# Patient Record
Sex: Female | Born: 1985 | Race: Black or African American | Hispanic: No | Marital: Single | Smoking: Former smoker
Health system: Southern US, Community
[De-identification: ages and names within clinical notes are randomized; demographics above are authoritative.]

## PROBLEM LIST (undated history)

## (undated) ENCOUNTER — Inpatient Hospital Stay (HOSPITAL_COMMUNITY): Payer: Self-pay

## (undated) DIAGNOSIS — B999 Unspecified infectious disease: Secondary | ICD-10-CM

## (undated) DIAGNOSIS — D219 Benign neoplasm of connective and other soft tissue, unspecified: Secondary | ICD-10-CM

## (undated) DIAGNOSIS — I10 Essential (primary) hypertension: Secondary | ICD-10-CM

## (undated) DIAGNOSIS — D649 Anemia, unspecified: Secondary | ICD-10-CM

## (undated) DIAGNOSIS — R87629 Unspecified abnormal cytological findings in specimens from vagina: Secondary | ICD-10-CM

## (undated) DIAGNOSIS — A599 Trichomoniasis, unspecified: Secondary | ICD-10-CM

## (undated) HISTORY — PX: NO PAST SURGERIES: SHX2092

---
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
## 2009-06-25 ENCOUNTER — Emergency Department (HOSPITAL_COMMUNITY): Admission: EM | Admit: 2009-06-25 | Discharge: 2009-06-25 | Payer: Self-pay | Admitting: Emergency Medicine

## 2010-12-14 LAB — POCT PREGNANCY, URINE: Operator id: 140111

## 2011-05-30 ENCOUNTER — Emergency Department (HOSPITAL_COMMUNITY)
Admission: EM | Admit: 2011-05-30 | Discharge: 2011-05-30 | Disposition: A | Discharge status: Home / Self Care | Payer: Medicaid Other | Attending: Emergency Medicine | Admitting: Emergency Medicine

## 2011-05-30 ENCOUNTER — Encounter (HOSPITAL_COMMUNITY): Payer: Self-pay | Admitting: *Deleted

## 2011-05-30 DIAGNOSIS — R51 Headache: Secondary | ICD-10-CM | POA: Insufficient documentation

## 2011-05-30 DIAGNOSIS — S0003XA Contusion of scalp, initial encounter: Secondary | ICD-10-CM | POA: Insufficient documentation

## 2011-05-30 DIAGNOSIS — S0083XA Contusion of other part of head, initial encounter: Secondary | ICD-10-CM

## 2011-05-30 DIAGNOSIS — S0990XA Unspecified injury of head, initial encounter: Secondary | ICD-10-CM | POA: Insufficient documentation

## 2011-05-30 DIAGNOSIS — H5789 Other specified disorders of eye and adnexa: Secondary | ICD-10-CM | POA: Insufficient documentation

## 2011-05-30 DIAGNOSIS — H1130 Conjunctival hemorrhage, unspecified eye: Secondary | ICD-10-CM | POA: Insufficient documentation

## 2011-05-30 MED ORDER — IBUPROFEN 200 MG PO TABS
400.0000 mg | ORAL_TABLET | Freq: Once | ORAL | Status: AC
  Administered 2011-05-30: 400 mg via ORAL
  Filled 2011-05-30: qty 2

## 2011-05-30 MED ORDER — OXYCODONE-ACETAMINOPHEN 5-325 MG PO TABS
2.0000 | ORAL_TABLET | Freq: Once | ORAL | Status: AC
  Administered 2011-05-30: 2 via ORAL
  Filled 2011-05-30: qty 2
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
## 2014-03-10 ENCOUNTER — Inpatient Hospital Stay (HOSPITAL_COMMUNITY)
Admission: AD | Admit: 2014-03-10 | Discharge: 2014-03-10 | Disposition: A | Discharge status: Home / Self Care | Payer: Medicaid Other | Source: Ambulatory Visit | Attending: Obstetrics & Gynecology | Admitting: Obstetrics & Gynecology

## 2014-03-10 ENCOUNTER — Inpatient Hospital Stay (HOSPITAL_COMMUNITY): Payer: Medicaid Other

## 2014-03-10 ENCOUNTER — Encounter (HOSPITAL_COMMUNITY): Payer: Self-pay | Admitting: *Deleted

## 2014-03-10 DIAGNOSIS — N939 Abnormal uterine and vaginal bleeding, unspecified: Secondary | ICD-10-CM | POA: Diagnosis present

## 2014-03-10 DIAGNOSIS — O99331 Smoking (tobacco) complicating pregnancy, first trimester: Secondary | ICD-10-CM | POA: Insufficient documentation

## 2014-03-10 DIAGNOSIS — A5901 Trichomonal vulvovaginitis: Secondary | ICD-10-CM | POA: Insufficient documentation

## 2014-03-10 DIAGNOSIS — F17210 Nicotine dependence, cigarettes, uncomplicated: Secondary | ICD-10-CM | POA: Insufficient documentation

## 2014-03-10 DIAGNOSIS — Z3A01 Less than 8 weeks gestation of pregnancy: Secondary | ICD-10-CM | POA: Diagnosis not present

## 2014-03-10 DIAGNOSIS — O26851 Spotting complicating pregnancy, first trimester: Secondary | ICD-10-CM | POA: Insufficient documentation

## 2014-03-10 DIAGNOSIS — O98311 Other infections with a predominantly sexual mode of transmission complicating pregnancy, first trimester: Secondary | ICD-10-CM | POA: Insufficient documentation

## 2014-03-10 DIAGNOSIS — O209 Hemorrhage in early pregnancy, unspecified: Secondary | ICD-10-CM

## 2014-03-10 HISTORY — DX: Unspecified infectious disease: B99.9

## 2014-03-10 HISTORY — DX: Anemia, unspecified: D64.9

## 2014-03-10 LAB — CBC WITH DIFFERENTIAL/PLATELET
BASOS ABS: 0.1 10*3/uL (ref 0.0–0.1)
Basophils Relative: 1 % (ref 0–1)
Eosinophils Absolute: 0.5 10*3/uL (ref 0.0–0.7)
Eosinophils Relative: 4 % (ref 0–5)
HEMATOCRIT: 34.6 % — AB (ref 36.0–46.0)
HEMOGLOBIN: 11.7 g/dL — AB (ref 12.0–15.0)
Lymphocytes Relative: 14 % (ref 12–46)
Lymphs Abs: 1.4 10*3/uL (ref 0.7–4.0)
MCH: 31.4 pg (ref 26.0–34.0)
MCHC: 33.8 g/dL (ref 30.0–36.0)
MCV: 92.8 fL (ref 78.0–100.0)
MONO ABS: 1.2 10*3/uL — AB (ref 0.1–1.0)
Monocytes Relative: 12 % (ref 3–12)
Neutro Abs: 7.3 10*3/uL (ref 1.7–7.7)
Neutrophils Relative %: 69 % (ref 43–77)
Platelets: 208 10*3/uL (ref 150–400)
RBC: 3.73 MIL/uL — AB (ref 3.87–5.11)
RDW: 13.2 % (ref 11.5–15.5)
WBC: 10.4 10*3/uL (ref 4.0–10.5)

## 2014-03-10 LAB — HCG, QUANTITATIVE, PREGNANCY: HCG, BETA CHAIN, QUANT, S: 43675 m[IU]/mL — AB (ref <5)

## 2014-03-10 LAB — URINE MICROSCOPIC-ADD ON

## 2014-03-10 LAB — URINALYSIS, ROUTINE W REFLEX MICROSCOPIC
BILIRUBIN URINE: NEGATIVE
GLUCOSE, UA: NEGATIVE mg/dL
KETONES UR: NEGATIVE mg/dL
Nitrite: NEGATIVE
PH: 6 (ref 5.0–8.0)
Protein, ur: NEGATIVE mg/dL
Specific Gravity, Urine: 1.025 (ref 1.005–1.030)
Urobilinogen, UA: 0.2 mg/dL (ref 0.0–1.0)

## 2014-03-10 LAB — ABO/RH: ABO/RH(D): B POS

## 2014-03-10 LAB — WET PREP, GENITAL: Yeast Wet Prep HPF POC: NONE SEEN

## 2014-03-10 LAB — POCT PREGNANCY, URINE: Preg Test, Ur: POSITIVE — AB

## 2014-03-10 MED ORDER — METRONIDAZOLE 500 MG PO TABS: 500.0000 mg | ORAL_TABLET | Freq: Two times a day (BID) | ORAL | Status: DC

## 2014-03-10 NOTE — Discharge Instructions (Signed)
Your wet prep today shows you have a trichomonas infection which causes irritation of the cervix and bleeding. We are treating you with an antibiotic for the infection. It is important that your sex partner be treated because this is a sexually transmitted disease. Men usually do not have symptoms but if not treated will re infect you. No sex for one week after you have both been treated.   Follow up with the health department to start your prenatal care.  Start your prenatal vitamins.

## 2014-03-10 NOTE — MAU Provider Note (Signed)
CSN: [id number]     Arrival date & time 03/10/14  1301 History   None    Chief Complaint  Patient presents with  . Menstrual Problem     (Consider location/radiation/quality/duration/timing/severity/associated sxs/prior Treatment) Patient is a 29 y.o. female presenting with vaginal bleeding. The history is provided by the patient.  Vaginal Bleeding This is a new problem. The current episode started in the past 7 days. The problem occurs intermittently. The problem has been unchanged. Associated symptoms include nausea. Abdominal pain: cramping. Nothing aggravates the symptoms. She has tried NSAIDs for the symptoms. The treatment provided moderate relief.   Gabriella Anderson is a 29 y.o. G1P0000 @ [redacted]w[redacted]d gestation who presents to the MAU with spotting and mild lower abdominal cramping x one week. LMP 03/28/13. No birth control. Current sex partner x 2 months. This is not a planned pregnancy but patient does plan to keep the pregnancy. Hx of Chlamydia 8 months ago and was treated. Last pap smear 2008 and was normal.   Past Medical History  Diagnosis Date  . Anemia   . Infection     UTI   Past Surgical History  Procedure Laterality Date  . No past surgeries     Family History  Problem Relation Age of Onset  . Hypertension Mother   . Cancer Mother     Leukemia  . Diabetes Maternal Grandfather   . Hypertension Maternal Grandfather   . Hearing loss Neg Hx    History  Substance Use Topics  . Smoking status: Current Every Day Smoker -- 0.50 packs/day for 8 years    Types: Cigarettes  . Smokeless tobacco: Never Used  . Alcohol Use: No   OB History    Gravida Para Term Preterm AB TAB SAB Ectopic Multiple Living   1 0 0 0 0 0 0 0 0 0      Review of Systems  Gastrointestinal: Positive for nausea. Abdominal pain: cramping.  Genitourinary: Positive for vaginal bleeding.  all other systems negative    Allergies  Review of patient's allergies indicates no known allergies.  Home  Medications   Prior to Admission medications   Medication Sig Start Date End Date Taking? Authorizing Provider  metroNIDAZOLE (FLAGYL) 500 MG tablet Take 1 tablet (500 mg total) by mouth 2 (two) times daily. 03/10/14   Hope Bunnie Pion, NP   BP 129/68 mmHg  Pulse 69  Temp(Src) 98.1 F (36.7 C)  Resp 16  Ht 6' (1.829 m)  Wt 204 lb (92.534 kg)  BMI 27.66 kg/m2  LMP 01/28/2014 Physical Exam  Constitutional: She is oriented to person, place, and time. She appears well-developed and well-nourished.  HENT:  Head: Normocephalic.  Eyes: EOM are normal.  Neck: Neck supple.  Cardiovascular: Normal rate.   Pulmonary/Chest: Effort normal.  Abdominal: Soft. There is no tenderness.  Genitourinary:  External genitalia without lesions. Frothy blood tinged d/c vaginal vault. Cervix inflamed, uterus approximately 8 week size. No adnexal tenderness or mass palpated.   Musculoskeletal: Normal range of motion.  Neurological: She is alert and oriented to person, place, and time. No cranial nerve deficit.  Skin: Skin is warm and dry.  Psychiatric: She has a normal mood and affect. Her behavior is normal.  Nursing note and vitals reviewed.   ED Course  Procedures (including critical care time) Labs Review Results for orders placed or performed during the hospital encounter of 03/10/14 (from the past 72 hour(s))  Urinalysis, Routine w reflex microscopic     Status:  Abnormal   Collection Time: 03/10/14  1:15 PM  Result Value Ref Range   Color, Urine YELLOW YELLOW   APPearance CLEAR CLEAR   Specific Gravity, Urine 1.025 1.005 - 1.030   pH 6.0 5.0 - 8.0   Glucose, UA NEGATIVE NEGATIVE mg/dL   Hgb urine dipstick MODERATE (A) NEGATIVE   Bilirubin Urine NEGATIVE NEGATIVE   Ketones, ur NEGATIVE NEGATIVE mg/dL   Protein, ur NEGATIVE NEGATIVE mg/dL   Urobilinogen, UA 0.2 0.0 - 1.0 mg/dL   Nitrite NEGATIVE NEGATIVE   Leukocytes, UA SMALL (A) NEGATIVE  Urine microscopic-add on     Status: Abnormal    Collection Time: 03/10/14  1:15 PM  Result Value Ref Range   Squamous Epithelial / LPF MANY (A) RARE   WBC, UA 11-20 <3 WBC/hpf   RBC / HPF 3-6 <3 RBC/hpf   Bacteria, UA RARE RARE   Casts HYALINE CASTS (A) NEGATIVE   Urine-Other MUCOUS PRESENT     Comment: TRICHOMONAS PRESENT  Pregnancy, urine POC     Status: Abnormal   Collection Time: 03/10/14  1:28 PM  Result Value Ref Range   Preg Test, Ur POSITIVE (A) NEGATIVE    Comment:        THE SENSITIVITY OF THIS METHODOLOGY IS >24 mIU/mL   Wet prep, genital     Status: Abnormal   Collection Time: 03/10/14  2:00 PM  Result Value Ref Range   Yeast Wet Prep HPF POC NONE SEEN NONE SEEN   Trich, Wet Prep MANY (A) NONE SEEN   Clue Cells Wet Prep HPF POC MODERATE (A) NONE SEEN   WBC, Wet Prep HPF POC FEW (A) NONE SEEN    Comment: MODERATE BACTERIA SEEN  CBC with Differential     Status: Abnormal   Collection Time: 03/10/14  2:03 PM  Result Value Ref Range   WBC 10.4 4.0 - 10.5 K/uL   RBC 3.73 (L) 3.87 - 5.11 MIL/uL   Hemoglobin 11.7 (L) 12.0 - 15.0 g/dL   HCT 34.6 (L) 36.0 - 46.0 %   MCV 92.8 78.0 - 100.0 fL   MCH 31.4 26.0 - 34.0 pg   MCHC 33.8 30.0 - 36.0 g/dL   RDW 13.2 11.5 - 15.5 %   Platelets 208 150 - 400 K/uL   Neutrophils Relative % 69 43 - 77 %   Neutro Abs 7.3 1.7 - 7.7 K/uL   Lymphocytes Relative 14 12 - 46 %   Lymphs Abs 1.4 0.7 - 4.0 K/uL   Monocytes Relative 12 3 - 12 %   Monocytes Absolute 1.2 (H) 0.1 - 1.0 K/uL   Eosinophils Relative 4 0 - 5 %   Eosinophils Absolute 0.5 0.0 - 0.7 K/uL   Basophils Relative 1 0 - 1 %   Basophils Absolute 0.1 0.0 - 0.1 K/uL  hCG, quantitative, pregnancy     Status: Abnormal   Collection Time: 03/10/14  2:03 PM  Result Value Ref Range   hCG, Beta Chain, Quant, S 43675 (H) <5 mIU/mL    Comment:          GEST. AGE      CONC.  (mIU/mL)   <=1 WEEK        5 - 50     2 WEEKS       50 - 500     3 WEEKS       100 - 10,000     4 WEEKS     1,000 - 30,000  5 WEEKS     3,500 -  115,000   6-8 WEEKS     12,000 - 270,000    12 WEEKS     15,000 - 220,000        FEMALE AND NON-PREGNANT FEMALE:     LESS THAN 5 mIU/mL   ABO/Rh     Status: None (Preliminary result)   Collection Time: 03/10/14  2:03 PM  Result Value Ref Range   ABO/RH(D) B POS    US Ob Comp Less 14 Wks  03/10/2014   CLINICAL DATA:  Bleeding, spotting for 1 week  EXAM: OBSTETRIC <14 WK [place] AND TRANSVAGINAL OB US  TECHNIQUE: Both transabdominal and transvaginal ultrasound examinations were performed for complete evaluation of the gestation as well as the maternal uterus, adnexal regions, and pelvic cul-de-sac. Transvaginal technique was performed to assess early pregnancy.  COMPARISON:  None.  FINDINGS: Intrauterine gestational sac: Visualized/normal in shape.  Yolk sac:  Present  Embryo:  Present  Cardiac Activity: Present  Heart Rate:  144 bpm  CRL:   9.3  mm   7 w 0 d                  [place] EDC: 10/27/2014  Maternal uterus/adnexae: No subchorionic hemorrhage. Small amounts of pelvic free fluid likely physiologic. There is a focal hypoechoic area in the right ovary with posterior acoustic shadowing and with an area of calcification measuring 2.7 x 2.1 x 2.4 cm which may represent a ovarian dermoid.  IMPRESSION: 1. Single live intrauterine pregnancy as detailed above. 2. There is a focal hypoechoic area in the right ovary with posterior acoustic shadowing and with an area of calcification measuring 2.7 x 2.1 x 2.4 cm which may represent a ovarian dermoid.   Electronically Signed   By: Kathreen Devoid   On: 03/10/2014 15:11   US Ob Transvaginal  03/10/2014   CLINICAL DATA:  Bleeding, spotting for 1 week  EXAM: OBSTETRIC <14 WK [place] AND TRANSVAGINAL OB US  TECHNIQUE: Both transabdominal and transvaginal ultrasound examinations were performed for complete evaluation of the gestation as well as the maternal uterus, adnexal regions, and pelvic cul-de-sac. Transvaginal technique was performed to assess early pregnancy.  COMPARISON:   None.  FINDINGS: Intrauterine gestational sac: Visualized/normal in shape.  Yolk sac:  Present  Embryo:  Present  Cardiac Activity: Present  Heart Rate:  144 bpm  CRL:   9.3  mm   7 w 0 d                  [place] EDC: 10/27/2014  Maternal uterus/adnexae: No subchorionic hemorrhage. Small amounts of pelvic free fluid likely physiologic. There is a focal hypoechoic area in the right ovary with posterior acoustic shadowing and with an area of calcification measuring 2.7 x 2.1 x 2.4 cm which may represent a ovarian dermoid.  IMPRESSION: 1. Single live intrauterine pregnancy as detailed above. 2. There is a focal hypoechoic area in the right ovary with posterior acoustic shadowing and with an area of calcification measuring 2.7 x 2.1 x 2.4 cm which may represent a ovarian dermoid.   Electronically Signed   By: Kathreen Devoid   On: 03/10/2014 15:11     MDM  29 y.o. female with spotting and mild cramping in early pregnancy. Viable IUP on ultrasound, trichomonas on wet prep. Stable for discharge without hemorrhage or acute abdomen. Will treat for trichomonas. Discussed with the patient clinical and lab findings and need for partner treatment  of trichomonas. Patient voices understanding and agrees with plan. She will start prenatal vitamins and prenatal care. She will return here as needed for problems.   Final diagnoses:  Trichomonas vaginitis  First trimester bleeding

## 2014-03-10 NOTE — MAU Note (Signed)
Pt presents to MAU with complaints of having vaginal spotting for a week with her last normal menstrual cycle the end of November. Reports some abdominal cramping Friday but denies any at this time.

## 2014-03-11 LAB — HIV ANTIBODY (ROUTINE TESTING W REFLEX)
HIV 1/HIV 2 AB: NONREACTIVE
HIV 1/O/2 Abs-Index Value: <1 (ref <1.00)

## 2014-03-11 LAB — RPR: RPR Ser Ql: NONREACTIVE

## 2014-03-11 LAB — GC/CHLAMYDIA PROBE AMP
CT Probe RNA: NEGATIVE
GC Probe RNA: NEGATIVE

## 2014-04-10 ENCOUNTER — Encounter: Payer: Medicaid Other | Admitting: Physician Assistant

## 2014-04-10 ENCOUNTER — Encounter: Payer: Self-pay | Admitting: Obstetrics & Gynecology

## 2014-04-10 ENCOUNTER — Encounter: Payer: Self-pay | Admitting: Physician Assistant

## 2014-04-11 ENCOUNTER — Other Ambulatory Visit (HOSPITAL_COMMUNITY): Payer: Self-pay | Admitting: Physician Assistant

## 2014-04-11 DIAGNOSIS — A599 Trichomoniasis, unspecified: Secondary | ICD-10-CM

## 2014-04-11 DIAGNOSIS — Z3682 Encounter for antenatal screening for nuchal translucency: Secondary | ICD-10-CM

## 2014-04-11 HISTORY — DX: Trichomoniasis, unspecified: A59.9

## 2014-04-11 LAB — OB RESULTS CONSOLE RUBELLA ANTIBODY, IGM: RUBELLA: IMMUNE

## 2014-04-11 LAB — OB RESULTS CONSOLE ABO/RH: RH TYPE: POSITIVE

## 2014-04-11 LAB — OB RESULTS CONSOLE ANTIBODY SCREEN: ANTIBODY SCREEN: NEGATIVE

## 2014-04-11 LAB — OB RESULTS CONSOLE HEPATITIS B SURFACE ANTIGEN: HEP B S AG: NEGATIVE

## 2014-04-11 LAB — OB RESULTS CONSOLE HIV ANTIBODY (ROUTINE TESTING): HIV: NONREACTIVE

## 2014-04-22 ENCOUNTER — Ambulatory Visit (HOSPITAL_COMMUNITY)
Admission: RE | Admit: 2014-04-22 | Discharge: 2014-04-22 | Disposition: A | Discharge status: Home / Self Care | Payer: Medicaid Other | Source: Ambulatory Visit | Attending: Physician Assistant | Admitting: Physician Assistant

## 2014-04-22 ENCOUNTER — Encounter (HOSPITAL_COMMUNITY): Payer: Self-pay

## 2014-04-22 ENCOUNTER — Other Ambulatory Visit (HOSPITAL_COMMUNITY): Payer: Self-pay

## 2014-04-22 DIAGNOSIS — Z36 Encounter for antenatal screening of mother: Secondary | ICD-10-CM | POA: Diagnosis present

## 2014-04-22 DIAGNOSIS — Z3A13 13 weeks gestation of pregnancy: Secondary | ICD-10-CM | POA: Insufficient documentation

## 2014-04-22 DIAGNOSIS — Z3682 Encounter for antenatal screening for nuchal translucency: Secondary | ICD-10-CM | POA: Insufficient documentation

## 2014-04-30 ENCOUNTER — Encounter: Payer: Self-pay | Admitting: General Practice

## 2014-05-09 ENCOUNTER — Other Ambulatory Visit (HOSPITAL_COMMUNITY): Payer: Self-pay | Admitting: Nurse Practitioner

## 2014-05-09 DIAGNOSIS — Z3689 Encounter for other specified antenatal screening: Secondary | ICD-10-CM

## 2014-06-06 ENCOUNTER — Ambulatory Visit (HOSPITAL_COMMUNITY)
Admission: RE | Admit: 2014-06-06 | Discharge: 2014-06-06 | Disposition: A | Discharge status: Home / Self Care | Payer: Medicaid Other | Source: Ambulatory Visit | Attending: Nurse Practitioner | Admitting: Nurse Practitioner

## 2014-06-06 DIAGNOSIS — Z3A19 19 weeks gestation of pregnancy: Secondary | ICD-10-CM | POA: Insufficient documentation

## 2014-06-06 DIAGNOSIS — Z36 Encounter for antenatal screening of mother: Secondary | ICD-10-CM | POA: Insufficient documentation

## 2014-06-06 DIAGNOSIS — Z3689 Encounter for other specified antenatal screening: Secondary | ICD-10-CM | POA: Insufficient documentation

## 2014-06-06 LAB — OB RESULTS CONSOLE GC/CHLAMYDIA
Chlamydia: NEGATIVE
Gonorrhea: NEGATIVE

## 2014-06-19 ENCOUNTER — Other Ambulatory Visit (HOSPITAL_COMMUNITY): Payer: Self-pay | Admitting: Nurse Practitioner

## 2014-06-19 DIAGNOSIS — IMO0002 Reserved for concepts with insufficient information to code with codable children: Secondary | ICD-10-CM

## 2014-06-19 DIAGNOSIS — Z0489 Encounter for examination and observation for other specified reasons: Secondary | ICD-10-CM

## 2014-07-04 ENCOUNTER — Ambulatory Visit (HOSPITAL_COMMUNITY)
Admission: RE | Admit: 2014-07-04 | Discharge: 2014-07-04 | Disposition: A | Discharge status: Home / Self Care | Payer: Medicaid Other | Source: Ambulatory Visit | Attending: Nurse Practitioner | Admitting: Nurse Practitioner

## 2014-07-04 DIAGNOSIS — Z3A23 23 weeks gestation of pregnancy: Secondary | ICD-10-CM | POA: Insufficient documentation

## 2014-07-04 DIAGNOSIS — IMO0002 Reserved for concepts with insufficient information to code with codable children: Secondary | ICD-10-CM

## 2014-07-04 DIAGNOSIS — Z36 Encounter for antenatal screening of mother: Secondary | ICD-10-CM | POA: Insufficient documentation

## 2014-07-04 DIAGNOSIS — Z0489 Encounter for examination and observation for other specified reasons: Secondary | ICD-10-CM

## 2014-10-03 ENCOUNTER — Other Ambulatory Visit (HOSPITAL_COMMUNITY): Payer: Self-pay | Admitting: Nurse Practitioner

## 2014-10-03 DIAGNOSIS — O365930 Maternal care for other known or suspected poor fetal growth, third trimester, not applicable or unspecified: Secondary | ICD-10-CM

## 2014-10-03 DIAGNOSIS — O3663X0 Maternal care for excessive fetal growth, third trimester, not applicable or unspecified: Secondary | ICD-10-CM

## 2014-10-03 LAB — OB RESULTS CONSOLE GBS: GBS: POSITIVE

## 2014-10-09 ENCOUNTER — Ambulatory Visit (HOSPITAL_COMMUNITY)
Admission: RE | Admit: 2014-10-09 | Discharge: 2014-10-09 | Disposition: A | Discharge status: Home / Self Care | Payer: Medicaid Other | Source: Ambulatory Visit | Attending: Nurse Practitioner | Admitting: Nurse Practitioner

## 2014-10-09 DIAGNOSIS — O365930 Maternal care for other known or suspected poor fetal growth, third trimester, not applicable or unspecified: Secondary | ICD-10-CM | POA: Diagnosis present

## 2014-10-17 ENCOUNTER — Encounter (HOSPITAL_COMMUNITY): Payer: Self-pay | Admitting: *Deleted

## 2014-10-17 ENCOUNTER — Other Ambulatory Visit: Payer: Self-pay | Admitting: Obstetrics & Gynecology

## 2014-10-17 ENCOUNTER — Inpatient Hospital Stay (HOSPITAL_COMMUNITY)
Admission: AD | Admit: 2014-10-17 | Discharge: 2014-10-20 | DRG: 775 | Disposition: A | Discharge status: Home / Self Care | Payer: Medicaid Other | Source: Ambulatory Visit | Attending: Family Medicine | Admitting: Family Medicine

## 2014-10-17 DIAGNOSIS — O99824 Streptococcus B carrier state complicating childbirth: Secondary | ICD-10-CM | POA: Diagnosis present

## 2014-10-17 DIAGNOSIS — Z8249 Family history of ischemic heart disease and other diseases of the circulatory system: Secondary | ICD-10-CM | POA: Diagnosis not present

## 2014-10-17 DIAGNOSIS — Z3A38 38 weeks gestation of pregnancy: Secondary | ICD-10-CM | POA: Diagnosis present

## 2014-10-17 DIAGNOSIS — O133 Gestational [pregnancy-induced] hypertension without significant proteinuria, third trimester: Principal | ICD-10-CM | POA: Diagnosis present

## 2014-10-17 DIAGNOSIS — Z87891 Personal history of nicotine dependence: Secondary | ICD-10-CM | POA: Diagnosis not present

## 2014-10-17 DIAGNOSIS — Z833 Family history of diabetes mellitus: Secondary | ICD-10-CM | POA: Diagnosis not present

## 2014-10-17 DIAGNOSIS — Z806 Family history of leukemia: Secondary | ICD-10-CM

## 2014-10-17 DIAGNOSIS — Z3490 Encounter for supervision of normal pregnancy, unspecified, unspecified trimester: Secondary | ICD-10-CM

## 2014-10-17 HISTORY — DX: Trichomoniasis, unspecified: A59.9

## 2014-10-17 LAB — PROTEIN / CREATININE RATIO, URINE
Creatinine, Urine: 219 mg/dL
Protein Creatinine Ratio: 0.11 mg/mg{Cre} (ref 0.00–0.15)
TOTAL PROTEIN, URINE: 25 mg/dL

## 2014-10-17 LAB — TYPE AND SCREEN
ABO/RH(D): B POS
Antibody Screen: NEGATIVE

## 2014-10-17 LAB — COMPREHENSIVE METABOLIC PANEL
ALBUMIN: 3.1 g/dL — AB (ref 3.5–5.0)
ALT: 12 U/L — AB (ref 14–54)
AST: 21 U/L (ref 15–41)
Alkaline Phosphatase: 161 U/L — ABNORMAL HIGH (ref 38–126)
Anion gap: 11 (ref 5–15)
BUN: 10 mg/dL (ref 6–20)
CHLORIDE: 105 mmol/L (ref 101–111)
CO2: 21 mmol/L — AB (ref 22–32)
CREATININE: 0.59 mg/dL (ref 0.44–1.00)
Calcium: 9.9 mg/dL (ref 8.9–10.3)
GFR calc non Af Amer: >60 mL/min (ref >60)
GLUCOSE: 116 mg/dL — AB (ref 65–99)
Potassium: 4.2 mmol/L (ref 3.5–5.1)
SODIUM: 137 mmol/L (ref 135–145)
Total Bilirubin: 0.7 mg/dL (ref 0.3–1.2)
Total Protein: 6.6 g/dL (ref 6.5–8.1)

## 2014-10-17 LAB — CBC
HEMATOCRIT: 34.5 % — AB (ref 36.0–46.0)
HEMOGLOBIN: 11.6 g/dL — AB (ref 12.0–15.0)
MCH: 31.8 pg (ref 26.0–34.0)
MCHC: 33.6 g/dL (ref 30.0–36.0)
MCV: 94.5 fL (ref 78.0–100.0)
Platelets: 283 10*3/uL (ref 150–400)
RBC: 3.65 MIL/uL — AB (ref 3.87–5.11)
RDW: 14.1 % (ref 11.5–15.5)
WBC: 12.5 10*3/uL — ABNORMAL HIGH (ref 4.0–10.5)

## 2014-10-17 MED ORDER — PENICILLIN G POTASSIUM 5000000 UNITS IJ SOLR: 5.0000 10*6.[IU] | Freq: Once | INTRAVENOUS | Status: DC

## 2014-10-17 MED ORDER — PENICILLIN G POTASSIUM 5000000 UNITS IJ SOLR: 2.5000 10*6.[IU] | INTRAVENOUS | Status: DC

## 2014-10-17 MED ORDER — ONDANSETRON HCL 4 MG/2ML IJ SOLN: 4.0000 mg | Freq: Four times a day (QID) | INTRAMUSCULAR | Status: DC | PRN

## 2014-10-17 MED ORDER — MISOPROSTOL 200 MCG PO TABS
50.0000 ug | ORAL_TABLET | ORAL | Status: DC
  Administered 2014-10-17 (×2): 50 ug via ORAL
  Filled 2014-10-17 (×2): qty 0.5

## 2014-10-17 MED ORDER — ACETAMINOPHEN 325 MG PO TABS: 650.0000 mg | ORAL_TABLET | ORAL | Status: DC | PRN

## 2014-10-17 MED ORDER — LACTATED RINGERS IV SOLN
INTRAVENOUS | Status: DC
  Administered 2014-10-18 (×2): via INTRAVENOUS

## 2014-10-17 MED ORDER — CITRIC ACID-SODIUM CITRATE 334-500 MG/5ML PO SOLN: 30.0000 mL | ORAL | Status: DC | PRN

## 2014-10-17 MED ORDER — OXYTOCIN 40 UNITS IN LACTATED RINGERS INFUSION - SIMPLE MED
1.0000 m[IU]/min | INTRAVENOUS | Status: DC
  Administered 2014-10-18: 2 m[IU]/min via INTRAVENOUS
  Filled 2014-10-17: qty 1000

## 2014-10-17 MED ORDER — PENICILLIN G POTASSIUM 5000000 UNITS IJ SOLR
5.0000 10*6.[IU] | Freq: Once | INTRAVENOUS | Status: AC
  Administered 2014-10-18: 5 10*6.[IU] via INTRAVENOUS
  Filled 2014-10-17 (×2): qty 5

## 2014-10-17 MED ORDER — OXYTOCIN 40 UNITS IN LACTATED RINGERS INFUSION - SIMPLE MED
62.5000 mL/h | INTRAVENOUS | Status: DC
  Administered 2014-10-18: 62.5 mL/h via INTRAVENOUS

## 2014-10-17 MED ORDER — OXYCODONE-ACETAMINOPHEN 5-325 MG PO TABS: 1.0000 | ORAL_TABLET | ORAL | Status: DC | PRN

## 2014-10-17 MED ORDER — OXYCODONE-ACETAMINOPHEN 5-325 MG PO TABS: 2.0000 | ORAL_TABLET | ORAL | Status: DC | PRN

## 2014-10-17 MED ORDER — ZOLPIDEM TARTRATE 5 MG PO TABS: 5.0000 mg | ORAL_TABLET | Freq: Every evening | ORAL | Status: DC | PRN

## 2014-10-17 MED ORDER — LIDOCAINE HCL (PF) 1 % IJ SOLN
30.0000 mL | INTRAMUSCULAR | Status: AC | PRN
  Administered 2014-10-18: 30 mL via SUBCUTANEOUS
  Filled 2014-10-17: qty 30

## 2014-10-17 MED ORDER — PENICILLIN G POTASSIUM 5000000 UNITS IJ SOLR
2.5000 10*6.[IU] | INTRAVENOUS | Status: DC
  Administered 2014-10-18: 2.5 10*6.[IU] via INTRAVENOUS
  Filled 2014-10-17 (×5): qty 2.5

## 2014-10-17 MED ORDER — OXYTOCIN BOLUS FROM INFUSION: 500.0000 mL | INTRAVENOUS | Status: DC

## 2014-10-17 MED ORDER — LACTATED RINGERS IV SOLN
500.0000 mL | INTRAVENOUS | Status: DC | PRN
  Administered 2014-10-18: 500 mL via INTRAVENOUS

## 2014-10-17 MED ORDER — FLEET ENEMA 7-19 GM/118ML RE ENEM: 1.0000 | ENEMA | RECTAL | Status: DC | PRN

## 2014-10-17 MED ORDER — TERBUTALINE SULFATE 1 MG/ML IJ SOLN
0.2500 mg | Freq: Once | INTRAMUSCULAR | Status: DC | PRN
  Filled 2014-10-17: qty 1

## 2014-10-17 NOTE — H&P (Signed)
LABOR ADMISSION HISTORY AND PHYSICAL  EEVEE BORBON is a 29 y.o. female G1P0000 with IUP at [redacted]w[redacted]d by LMP presenting for IOL 2/2 gHTN. She reports +FMs, No LOF, no VB, no blurry vision, headaches or peripheral edema, and RUQ pain.  She plans on breast feeding. She requests Nexplanon or Mirena for birth control.  GBS+   Dating: By LMP 01/20/14 --->  Estimated Date of Delivery: 10/27/14  Sono: 06/06/14 @[redacted]w[redacted]d , CWD, echogenic focus in left ventricle but otherwise normal anatomy, cephalic presentation, 364W, 43%tile EFW  Follow up U/S  -07/04/14 @[redacted]w[redacted]d  impression noted normal fetal anatomy and appropriate fetal growth (though did have note still indicate presence of echogenic focus in left ventricle) -10/09/14 @[redacted]w[redacted]d  noted fetal heart was normal, normal anatomy and appropriate interval growth with EFW in 70%tile  Prenatal History/Complications:  Past Medical History: Past Medical History  Diagnosis Date  . Anemia   . Infection     UTI    Past Surgical History: Past Surgical History  Procedure Laterality Date  . No past surgeries      Obstetrical History: OB History    Gravida Para Term Preterm AB TAB SAB Ectopic Multiple Living   1 0 0 0 0 0 0 0 0 0       Social History: Social History   Social History  . Marital Status: Single    Spouse Name: N/A  . Number of Children: N/A  . Years of Education: N/A   Social History Main Topics  . Smoking status: Former Smoker -- 0.25 packs/day for 8 years    Types: Cigarettes  . Smokeless tobacco: Never Used  . Alcohol Use: No  . Drug Use: 7.00 per week    Special: Marijuana     Comment: pt states "stopped about 2 months ago"  . Sexual Activity: Yes    Birth Control/ Protection: None   Other Topics Concern  . None   Social History Narrative    Family History: Family History  Problem Relation Age of Onset  . Hypertension Mother   . Cancer Mother     Leukemia  . Diabetes Maternal Grandfather   . Hypertension Maternal  Grandfather   . Hearing loss Neg Hx     Allergies: No Known Allergies  Prescriptions prior to admission  Medication Sig Dispense Refill Last Dose  . calcium carbonate (TUMS - DOSED IN MG ELEMENTAL CALCIUM) 500 MG chewable tablet Chew 1 tablet by mouth daily as needed for indigestion or heartburn.   10/17/2014 at 0630  . Prenatal Vit-Fe Fumarate-FA (PRENATAL VITAMIN PO) Take 2 tablets by mouth daily.    10/17/2014 at 1200  . metroNIDAZOLE (FLAGYL) 500 MG tablet Take 1 tablet (500 mg total) by mouth 2 (two) times daily. (Patient not taking: Reported on 04/22/2014) 14 tablet 0 Completed Course at Unknown time     Review of Systems   All systems reviewed and negative except as stated in HPI  Blood pressure 156/65, pulse 82, temperature 98.6 F (37 C), temperature source Oral, resp. rate 18, height 6' (1.829 m), weight 232 lb (105.235 kg), last menstrual period 01/20/2014. General appearance: alert, cooperative and no distress Lungs: clear to auscultation bilaterally Heart: regular rate and rhythm Abdomen: soft, non-tender; bowel sounds normal Pelvic: 0.5/50%/-3 Extremities: Homans sign is negative, no sign of DVT Presentation: cephalic Fetal monitoringBaseline: 145 bpm and Accelerations: Reactive Uterine activityNone   Prenatal labs: ABO, Rh: --/--/B POS (08/18 1715) Rubella:   immune RPR: Non Reactive (01/10 1403)  HBsAg: Negative (  02/11 0000) neg HIV: Non-reactive (02/11 0000) neg GBS: Positive (08/04 0000) positive 1 hr Glucola: 112 Genetic screening: negative   Anatomy US: echogenic focus in left ventricle but otherwise normal anatomy,  Prenatal Transfer Tool  Maternal Diabetes: No Genetic Screening: Normal Maternal Ultrasounds/Referrals: Normal Fetal Ultrasounds or other Referrals:  None Maternal Substance Abuse:  Yes:  Type: Marijuana Significant Maternal Medications:  None Significant Maternal Lab Results: None, varicella nonimmune  Results for orders placed or  performed during the hospital encounter of 10/17/14 (from the past 24 hour(s))  CBC   Collection Time: 10/17/14  5:15 PM  Result Value Ref Range   WBC 12.5 (H) 4.0 - 10.5 K/uL   RBC 3.65 (L) 3.87 - 5.11 MIL/uL   Hemoglobin 11.6 (L) 12.0 - 15.0 g/dL   HCT 34.5 (L) 36.0 - 46.0 %   MCV 94.5 78.0 - 100.0 fL   MCH 31.8 26.0 - 34.0 pg   MCHC 33.6 30.0 - 36.0 g/dL   RDW 14.1 11.5 - 15.5 %   Platelets 283 150 - 400 K/uL  Comprehensive metabolic panel   Collection Time: 10/17/14  5:15 PM  Result Value Ref Range   Sodium 137 135 - 145 mmol/L   Potassium 4.2 3.5 - 5.1 mmol/L   Chloride 105 101 - 111 mmol/L   CO2 21 (L) 22 - 32 mmol/L   Glucose, Bld 116 (H) 65 - 99 mg/dL   BUN 10 6 - 20 mg/dL   Creatinine, Ser 0.59 0.44 - 1.00 mg/dL   Calcium 9.9 8.9 - 10.3 mg/dL   Total Protein 6.6 6.5 - 8.1 g/dL   Albumin 3.1 (L) 3.5 - 5.0 g/dL   AST 21 15 - 41 U/L   ALT 12 (L) 14 - 54 U/L   Alkaline Phosphatase 161 (H) 38 - 126 U/L   Total Bilirubin 0.7 0.3 - 1.2 mg/dL   GFR calc non Af Amer >60 >60 mL/min   GFR calc Af Amer >60 >60 mL/min   Anion gap 11 5 - 15  Type and screen   Collection Time: 10/17/14  5:15 PM  Result Value Ref Range   ABO/RH(D) B POS    Antibody Screen NEG    Sample Expiration 10/20/2014   Protein / creatinine ratio, urine   Collection Time: 10/17/14  5:54 PM  Result Value Ref Range   Creatinine, Urine 219.00 mg/dL   Total Protein, Urine 25 mg/dL   Protein Creatinine Ratio 0.11 0.00 - 0.15 mg/mg[Cre]    Patient Active Problem List   Diagnosis Date Noted  . Pregnant and not yet delivered 10/17/2014  . Encounter for fetal anatomic survey   . [redacted] weeks gestation of pregnancy   . Encounter for (NT) nuchal translucency scan   . [redacted] weeks gestation of pregnancy     Assessment: BECKY COLAN is a 29 y.o. G1P0000 at [redacted]w[redacted]d here for IOL 2/2 gHTN.  #Labor: IOL with expectant management  #Pain: Undecided #FWB: Category 1 #ID:  GBS+. Will treat with PCN when in active  labor.  #MOF: Breast #MOC:Nexplanon or Mirena #Circ:  N/A  Ruthann Cancer, MD PGY-1 [facility]   10/17/2014, 7:17 PM    OB FELLOW HISTORY AND PHYSICAL ATTESTATION  I have seen and examined this patient; I agree with above documentation in the resident's note.   CANDIDA VETTER is a 29 y.o. G1P0000 @ 38+4 here for IOL 2/2 gHTN  PE: BP 156/65 mmHg  Pulse 82  Temp(Src) 98.6  F (37 C) (Oral)  Resp 18  Ht 6' (1.829 m)  Wt 232 lb (105.235 kg)  BMI 31.46 kg/m2  LMP 01/20/2014 Gen: calm comfortable, NAD Resp: normal effort, no distress Abd: gravid  ROS, labs, PMH reviewed  Plan: MOF: breast MOC: undecided, leaning LARC ID: PCN for gbs positive UTI w/o allergy GHTN: no severe-range pressures, asymptomatic. Will treat for sever-range BPs. Initial preeclampsia labs negative. FWB: cat 1 Labor: foley placed, starting oral cytotec, AROM when able Circ: n/a Pain: eventual epidural VNI: vaccinate PP MJ positive in pregnancy: repeat UDS, SW consult  Desma Maxim 10/17/2014, 7:20 PM

## 2014-10-17 NOTE — Plan of Care (Signed)
Problem: Consults Goal: Birthing Suites Patient Information Press F2 to bring up selections list  Outcome: Completed/Met Date Met:  10/17/14  Pt 37-[redacted] weeks EGA, Inpatient induction and PIH (Pregnancy induced hypertension)

## 2014-10-17 NOTE — Progress Notes (Signed)
Labor Progress Note  Gabriella Anderson is a 29 y.o. G1P0000 at [redacted]w[redacted]d  admitted for induction of labor due to [facility].  S: Doing well. No concerns. States that FB is uncomfortable.   O:  BP 150/56 mmHg  Pulse 73  Temp(Src) 98.6 F (37 C) (Oral)  Resp 18  Ht 6' (1.829 m)  Wt 232 lb (105.235 kg)  BMI 31.46 kg/m2  LMP 01/20/2014  FHT:  FHR: 135 bpm, variability: moderate,  accelerations:  Present,  decelerations:  Absent UC:   regular, every 2-5 minutes SVE:   Dilation: 1 Effacement (%): 50 Station:  (-4) Exam by::  (Dr. Si Raider)  Labs: Lab Results  Component Value Date   WBC 12.5* 10/17/2014   HGB 11.6* 10/17/2014   HCT 34.5* 10/17/2014   MCV 94.5 10/17/2014   PLT 283 10/17/2014    Assessment / Plan: 29 y.o. G1P0000 [redacted]w[redacted]d not in labor. Doing well.  Induction of labor due to gestational hypertension.  Labor: FB in place. Continue PO cytotec. Will start pitocin when FB out.  Fetal Wellbeing:  Category I Pain Control:  Labor support without medications Anticipated MOD:  NSVD  Expectant management   Luiz Blare, DO 10/17/2014, 8:38 PM PGY-2, [place]

## 2014-10-18 ENCOUNTER — Inpatient Hospital Stay (HOSPITAL_COMMUNITY): Payer: Medicaid Other | Admitting: Anesthesiology

## 2014-10-18 ENCOUNTER — Encounter (HOSPITAL_COMMUNITY): Payer: Self-pay | Admitting: *Deleted

## 2014-10-18 DIAGNOSIS — O133 Gestational [pregnancy-induced] hypertension without significant proteinuria, third trimester: Secondary | ICD-10-CM

## 2014-10-18 DIAGNOSIS — Z3A38 38 weeks gestation of pregnancy: Secondary | ICD-10-CM

## 2014-10-18 DIAGNOSIS — Z8249 Family history of ischemic heart disease and other diseases of the circulatory system: Secondary | ICD-10-CM

## 2014-10-18 DIAGNOSIS — O99824 Streptococcus B carrier state complicating childbirth: Secondary | ICD-10-CM

## 2014-10-18 LAB — CBC
HEMATOCRIT: 32.1 % — AB (ref 36.0–46.0)
HEMATOCRIT: 33.1 % — AB (ref 36.0–46.0)
HEMOGLOBIN: 11.1 g/dL — AB (ref 12.0–15.0)
Hemoglobin: 10.9 g/dL — ABNORMAL LOW (ref 12.0–15.0)
MCH: 32.2 pg (ref 26.0–34.0)
MCH: 31.9 pg (ref 26.0–34.0)
MCHC: 34 g/dL (ref 30.0–36.0)
MCHC: 33.5 g/dL (ref 30.0–36.0)
MCV: 95 fL (ref 78.0–100.0)
MCV: 95.1 fL (ref 78.0–100.0)
PLATELETS: 245 10*3/uL (ref 150–400)
Platelets: 243 10*3/uL (ref 150–400)
RBC: 3.38 MIL/uL — AB (ref 3.87–5.11)
RBC: 3.48 MIL/uL — ABNORMAL LOW (ref 3.87–5.11)
RDW: 14.1 % (ref 11.5–15.5)
RDW: 14.1 % (ref 11.5–15.5)
WBC: 17.3 10*3/uL — AB (ref 4.0–10.5)
WBC: 21.4 10*3/uL — ABNORMAL HIGH (ref 4.0–10.5)

## 2014-10-18 LAB — RAPID URINE DRUG SCREEN, HOSP PERFORMED
Amphetamines: NOT DETECTED
BARBITURATES: NOT DETECTED
Benzodiazepines: NOT DETECTED
Cocaine: NOT DETECTED
Opiates: NOT DETECTED
Tetrahydrocannabinol: NOT DETECTED

## 2014-10-18 LAB — RPR: RPR: NONREACTIVE

## 2014-10-18 MED ORDER — OXYCODONE-ACETAMINOPHEN 5-325 MG PO TABS: 1.0000 | ORAL_TABLET | ORAL | Status: DC | PRN

## 2014-10-18 MED ORDER — FENTANYL CITRATE (PF) 100 MCG/2ML IJ SOLN
50.0000 ug | INTRAMUSCULAR | Status: DC | PRN
  Administered 2014-10-18: 50 ug via INTRAVENOUS
  Filled 2014-10-18: qty 2

## 2014-10-18 MED ORDER — OXYCODONE-ACETAMINOPHEN 5-325 MG PO TABS: 2.0000 | ORAL_TABLET | ORAL | Status: DC | PRN

## 2014-10-18 MED ORDER — BENZOCAINE-MENTHOL 20-0.5 % EX AERO
1.0000 "application " | INHALATION_SPRAY | CUTANEOUS | Status: DC | PRN
  Administered 2014-10-18 – 2014-10-20 (×2): 1 via TOPICAL
  Filled 2014-10-18 (×2): qty 56

## 2014-10-18 MED ORDER — ZOLPIDEM TARTRATE 5 MG PO TABS: 5.0000 mg | ORAL_TABLET | Freq: Every evening | ORAL | Status: DC | PRN

## 2014-10-18 MED ORDER — DIBUCAINE 1 % RE OINT: 1.0000 "application " | TOPICAL_OINTMENT | RECTAL | Status: DC | PRN

## 2014-10-18 MED ORDER — ONDANSETRON HCL 4 MG PO TABS: 4.0000 mg | ORAL_TABLET | ORAL | Status: DC | PRN

## 2014-10-18 MED ORDER — PHENYLEPHRINE 40 MCG/ML (10ML) SYRINGE FOR IV PUSH (FOR BLOOD PRESSURE SUPPORT)
80.0000 ug | PREFILLED_SYRINGE | INTRAVENOUS | Status: DC | PRN
Start: 2014-10-18 — End: 2014-10-18
  Filled 2014-10-18: qty 20
  Filled 2014-10-18: qty 2

## 2014-10-18 MED ORDER — LIDOCAINE-EPINEPHRINE (PF) 2 %-1:200000 IJ SOLN
INTRAMUSCULAR | Status: DC | PRN
  Administered 2014-10-18: 4 mL

## 2014-10-18 MED ORDER — DIPHENHYDRAMINE HCL 25 MG PO CAPS: 25.0000 mg | ORAL_CAPSULE | Freq: Four times a day (QID) | ORAL | Status: DC | PRN

## 2014-10-18 MED ORDER — WITCH HAZEL-GLYCERIN EX PADS: 1.0000 "application " | MEDICATED_PAD | CUTANEOUS | Status: DC | PRN

## 2014-10-18 MED ORDER — EPHEDRINE 5 MG/ML INJ
10.0000 mg | INTRAVENOUS | Status: DC | PRN
  Filled 2014-10-18: qty 2

## 2014-10-18 MED ORDER — ACETAMINOPHEN 325 MG PO TABS: 650.0000 mg | ORAL_TABLET | ORAL | Status: DC | PRN

## 2014-10-18 MED ORDER — PRENATAL MULTIVITAMIN CH
1.0000 | ORAL_TABLET | Freq: Every day | ORAL | Status: DC
  Administered 2014-10-19 – 2014-10-20 (×2): 1 via ORAL
  Filled 2014-10-18 (×2): qty 1

## 2014-10-18 MED ORDER — IBUPROFEN 600 MG PO TABS
600.0000 mg | ORAL_TABLET | Freq: Four times a day (QID) | ORAL | Status: DC
  Administered 2014-10-18 – 2014-10-20 (×8): 600 mg via ORAL
  Filled 2014-10-18 (×8): qty 1

## 2014-10-18 MED ORDER — DIPHENHYDRAMINE HCL 50 MG/ML IJ SOLN: 12.5000 mg | INTRAMUSCULAR | Status: DC | PRN

## 2014-10-18 MED ORDER — SENNOSIDES-DOCUSATE SODIUM 8.6-50 MG PO TABS
2.0000 | ORAL_TABLET | ORAL | Status: DC
  Administered 2014-10-19 (×2): 2 via ORAL
  Filled 2014-10-18 (×2): qty 2

## 2014-10-18 MED ORDER — ONDANSETRON HCL 4 MG/2ML IJ SOLN: 4.0000 mg | INTRAMUSCULAR | Status: DC | PRN

## 2014-10-18 MED ORDER — FENTANYL 2.5 MCG/ML BUPIVACAINE 1/10 % EPIDURAL INFUSION (WH - ANES)
14.0000 mL/h | INTRAMUSCULAR | Status: DC | PRN
  Administered 2014-10-18: 12 mL/h via EPIDURAL
  Filled 2014-10-18: qty 125

## 2014-10-18 MED ORDER — BUPIVACAINE HCL (PF) 0.25 % IJ SOLN
INTRAMUSCULAR | Status: DC | PRN
  Administered 2014-10-18 (×2): 4 mL

## 2014-10-18 MED ORDER — TETANUS-DIPHTH-ACELL PERTUSSIS 5-2.5-18.5 LF-MCG/0.5 IM SUSP: 0.5000 mL | Freq: Once | INTRAMUSCULAR | Status: DC

## 2014-10-18 MED ORDER — LANOLIN HYDROUS EX OINT: TOPICAL_OINTMENT | CUTANEOUS | Status: DC | PRN

## 2014-10-18 MED ORDER — SIMETHICONE 80 MG PO CHEW: 80.0000 mg | CHEWABLE_TABLET | ORAL | Status: DC | PRN

## 2014-10-18 NOTE — Progress Notes (Signed)
Labor Progress Note  Gabriella Anderson is a 29 y.o. G1P0000 at [redacted]w[redacted]d  admitted for induction of labor due to [facility].  S: Doing well and resting. No concerns.  O:  BP 140/53 mmHg  Pulse 58  Temp(Src) 97.8 F (36.6 C) (Oral)  Resp 18  Ht 6' (1.829 m)  Wt 232 lb (105.235 kg)  BMI 31.46 kg/m2  LMP 01/20/2014  FHT:  FHR: 135 bpm, variability: moderate,  accelerations:  Present,  decelerations:  Absent UC:   regular, every 2-5 minutes SVE:   Dilation: 5.5 Effacement (%): 70 Station: -2, -3 Exam by:: nlicato rn  Labs: Lab Results  Component Value Date   WBC 17.3* 10/18/2014   HGB 10.9* 10/18/2014   HCT 32.1* 10/18/2014   MCV 95.0 10/18/2014   PLT 245 10/18/2014    Assessment / Plan: 29 y.o. G1P0000 [redacted]w[redacted]d in early active labor. Doing well.  Induction of labor due to gestational hypertension.  Labor: FB out now. Pitocin started. Progressing normally. Fetal Wellbeing:  Category I Pain Control:  Fentanyl; plan for epidural  Anticipated MOD:  NSVD  Expectant management   Luiz Blare, DO 10/18/2014, 5:47 AM PGY-2, [place]

## 2014-10-18 NOTE — Anesthesia Preprocedure Evaluation (Signed)
Anesthesia Evaluation  Patient identified by MRN, date of birth, ID band Patient awake    Reviewed: Allergy & Precautions, Patient's Chart, lab work & pertinent test results  History of Anesthesia Complications Negative for: history of anesthetic complications  Airway Mallampati: III  TM Distance: >3 FB Neck ROM: Full    Dental  (+) Teeth Intact   Pulmonary former smoker,  breath sounds clear to auscultation        Cardiovascular hypertension, Rhythm:Regular     Neuro/Psych negative neurological ROS  negative psych ROS   GI/Hepatic negative GI ROS, Neg liver ROS,   Endo/Other  negative endocrine ROS  Renal/GU negative Renal ROS     Musculoskeletal   Abdominal   Peds  Hematology  (+) anemia ,   Anesthesia Other Findings   Reproductive/Obstetrics (+) Pregnancy                             Anesthesia Physical Anesthesia Plan  ASA: II  Anesthesia Plan: Epidural   Post-op Pain Management:    Induction:   Airway Management Planned:   Additional Equipment:   Intra-op Plan:   Post-operative Plan:   Informed Consent: I have reviewed the patients History and Physical, chart, labs and discussed the procedure including the risks, benefits and alternatives for the proposed anesthesia with the patient or authorized representative who has indicated his/her understanding and acceptance.   Dental advisory given  Plan Discussed with: Anesthesiologist  Anesthesia Plan Comments:         Anesthesia Quick Evaluation

## 2014-10-18 NOTE — Lactation Note (Addendum)
This note was copied from the chart of Gabriella Athziri Freundlich. Lactation Consultation Note  P1, Mother has wide spaced breasts.  States she did not have breast changes during pregnancy. Helped mother hand express a few drops of colostrum. Baby latched upon entering.  Repositioned baby for deeper latch in football position. Encouraged mother to compress breast to keep baby active. Reviewed basics, cluster feeding.  Reviewed pg 24 to monitor voids/stools. Mom encouraged to feed baby 8-12 times/24 hours and with feeding cues.  Mom made aware of O/P services, breastfeeding support groups, community resources, and our phone # for post-discharge questions.    Patient Name: Gabriella Anderson Today's Date: 10/18/2014 Reason for consult: Initial assessment   Maternal Data Has patient been taught Hand Expression?: Yes Does the patient have breastfeeding experience prior to this delivery?: No  Feeding Feeding Type: Breast Fed Length of feed: 20 min  LATCH Score/Interventions Latch: Grasps breast easily, tongue down, lips flanged, rhythmical sucking.  Audible Swallowing: A few with stimulation  Type of Nipple: Everted at rest and after stimulation  Comfort (Breast/Nipple): Soft / non-tender     Hold (Positioning): Assistance needed to correctly position infant at breast and maintain latch.  LATCH Score: 8  Lactation Tools Discussed/Used     Consult Status Consult Status: Follow-up Date: 10/19/14 Follow-up type: In-patient    Vivianne Master [facility] 10/18/2014, 8:43 PM

## 2014-10-18 NOTE — Anesthesia Procedure Notes (Signed)
Epidural Patient location during procedure: OB  Staffing Anesthesiologist: Caitriona Sundquist, CHRIS Performed by: anesthesiologist   Preanesthetic Checklist Completed: patient identified, surgical consent, pre-op evaluation, timeout performed, IV checked, risks and benefits discussed and monitors and equipment checked  Epidural Patient position: sitting Prep: DuraPrep Patient monitoring: heart rate, cardiac monitor, continuous pulse ox and blood pressure Approach: midline Location: L3-L4 Injection technique: LOR saline  Needle:  Needle type: Tuohy  Needle gauge: 17 G Needle length: 9 cm Needle insertion depth: 7 cm Catheter type: closed end flexible Catheter size: 19 Gauge Catheter at skin depth: 12 cm Test dose: negative and 2% lidocaine with Epi 1:200 K  Assessment Events: blood not aspirated, injection not painful, no injection resistance, negative IV test and no paresthesia  Additional Notes Reason for block:procedure for pain

## 2014-10-18 NOTE — Anesthesia Postprocedure Evaluation (Signed)
  Anesthesia Post-op Note  Patient: Gabriella Anderson  Procedure(s) Performed: * No procedures listed *  Patient Location: PACU and Mother/Baby  Anesthesia Type:Epidural  Level of Consciousness: awake, alert  and oriented  Airway and Oxygen Therapy: Patient Spontanous Breathing  Post-op Pain: none  Post-op Assessment: Post-op Vital signs reviewed and Patient's Cardiovascular Status Stable              Post-op Vital Signs: Reviewed and stable  Last Vitals:  Filed Vitals:   10/18/14 1233  BP: 137/79  Pulse: 75  Temp: 36.7 C  Resp: 18    Complications: No apparent anesthesia complications

## 2014-10-18 NOTE — Plan of Care (Signed)
Problem: Phase I Progression Outcomes Goal: Assess per MD/Nurse,Routine-VS,FHR,UC,Head to Toe assess Outcome: Progressing Pt has elevated BP's- monitoring BP's every 10-30 minutes. Pt instructed to notify nurse if Headache, visual disturbances, epigastric pain- verbalizes understanding. Labetalol IVP prn.

## 2014-10-19 NOTE — Progress Notes (Signed)
Post Partum Day 1  Subjective:  Gabriella Anderson is a 29 y.o. G1P1001 [redacted]w[redacted]d s/p SVD.  No acute events overnight.  Pt denies problems with ambulating, voiding or po intake.  She denies nausea or vomiting.  Pain is well controlled.  She has had flatus. She has not had bowel movement.  Lochia Small.  Plan for birth control is nexplanon.  Method of Feeding: Breast.  Objective: BP 134/67 mmHg  Pulse 67  Temp(Src) 98.4 F (36.9 C) (Oral)  Resp 18  Ht 6' (1.829 m)  Wt 232 lb (105.235 kg)  BMI 31.46 kg/m2  SpO2 98%  LMP 01/20/2014  Breastfeeding? Unknown  Physical Exam:  General: alert, cooperative and no distress Lochia:normal flow Chest: normal work of breathing Heart: RRR  Abdomen: +BS, soft, nontender Uterine Fundus: firm DVT Evaluation: No evidence of DVT seen on physical exam. Extremities: no edema   Recent Labs  10/18/14 0405 10/18/14 1202  HGB 10.9* 11.1*  HCT 32.1* 33.1*    Assessment/Plan:  ASSESSMENT: Gabriella Anderson is a 29 y.o. G1P1001 [redacted]w[redacted]d ppd #1 s/p NSVD doing well.   Plan for discharge tomorrow  Breastfeeding - lactation consult prn Continue routine post-partum care   LOS: 2 days   Luiz Blare, DO 10/19/2014, 7:30 AM PGY-2, [place]

## 2014-10-20 MED ORDER — DOCUSATE SODIUM 100 MG PO CAPS: 100.0000 mg | ORAL_CAPSULE | Freq: Two times a day (BID) | ORAL | Status: DC | PRN

## 2014-10-20 MED ORDER — IBUPROFEN 600 MG PO TABS: 600.0000 mg | ORAL_TABLET | Freq: Four times a day (QID) | ORAL | Status: DC

## 2014-10-20 NOTE — Lactation Note (Signed)
This note was copied from the chart of Gabriella Anderson. Lactation Consultation Note  Patient Name: Gabriella Anderson Anderson Date: 10/20/2014 Reason for consult: Follow-up assessment  Baby is 39 hours old @ the time of the consult. Baby noted to be fussy on and off.  [place] worked with mom on firm support , depth at the breast. LC noted the baby to latch with depth and  Pull back. Baby has been using  the pacifier when fussy per mom, and LC suspects due to lack of flow  The baby is puling back. LC recommended 2 options - 1st to use the DEBP and pump for 15 -20 mins  And if EBM Yield to supplement back to baby via SNS at the breast for extra stimulation. 2nd - if not EBM yield  To supplement with formula also due to 7% weight loss at the breast to enhance let down. Mom reported to [facility] RN and [facility] she had very limited breast changes and after LC assessed breast tissue at the  Breast noted both breast to be tubular, and the right breast to be underdeveloped.  LC discussed with mom due to limited breast changes with pregnancy , extra stimulation , especially in the 1st 2 weeks  Would be indicated to challenge moms brain and it would be a "Wait and see " process.  Due to 7% weight loss , it would be recommended to supplement at the breast. And to post pump both breast after 5-6 feedings a day for 10 -20 mins. Per mom. LC discussed this dyad's situation with Dr. Owens Shark and we both agreed changing this Baby to baby status  For feeding challenges. MBU RN aware to set up DEBP for this mom and to supplement.   Maternal Data    Feeding Feeding Type: Breast Fed Length of feed: 8 min  LATCH Score/Interventions Latch: Grasps breast easily, tongue down, lips flanged, rhythmical sucking. Intervention(s): Adjust position;Assist with latch;Breast massage;Breast compression  Audible Swallowing: A few with stimulation Intervention(s): Hand expression;Skin to skin  Type of Nipple: Everted at rest and  after stimulation  Comfort (Breast/Nipple): Soft / non-tender     Hold (Positioning): Assistance needed to correctly position infant at breast and maintain latch. Intervention(s): Breastfeeding basics reviewed;Support Pillows;Position options;Skin to skin  LATCH Score: 8  Lactation Tools Discussed/Used Tools: Pump Breast pump type: Manual WIC Program: Yes   Consult Status Consult Status: Follow-up Date: 10/20/14 Follow-up type: In-patient    Gabriella Anderson 10/20/2014, 9:50 AM

## 2014-10-20 NOTE — Discharge Summary (Signed)
Obstetric Discharge Summary Reason for Admission: induction of labor Prenatal Procedures: NST and ultrasound Intrapartum Procedures: spontaneous vaginal delivery Postpartum Procedures: none Complications-Operative and Postpartum: none  Delivery Summary: Upon arrival patient was complete and pushing. She pushed with good maternal effort to deliver a healthy baby girl. Baby delivered without difficulty, was noted to have good tone and place on maternal abdomen for oral suctioning, drying and stimulation. Delayed cord clamping performed. Placenta delivered intact with 3V cord. Vaginal canal and perineum was inspected and second degree perineal laceration was repaired. Pitocin was started and uterus massaged until bleeding slowed. Counts of sharps, instruments, and lap pads were all correct.   Delivery Note At 9:56 AM a viable female was delivered via Vaginal, IOL (Presentation: vertex). APGAR: 8, 9. Placenta status: Intact, Spontaneous. Cord: 3 vessels with the following complications: None. Cord pH: not sent.  Anesthesia: Epidural  Episiotomy: None Lacerations: 2nd degree;Perineal Suture Repair: 3.0 vicryl Est. Blood Loss (mL): 232 mL  Hospital Course:  Active Problems:   Pregnant and not yet delivered   Gabriella Anderson is a 29 y.o. G1P1001 s/p SVD.  Patient was admitted for IOL 2/2 gHTN.  She has postpartum course that was uncomplicated including no problems with ambulating, PO intake, urination, pain, or bleeding. The pt feels ready to go home and  will be discharged with outpatient follow-up.   Today: No acute events overnight.  Pt denies problems with ambulating, voiding or po intake.  She denies nausea or vomiting.  Pain is well controlled.  She has had flatus. She has had bowel movement.  Lochia Small.  Plan for birth control is Nexplanon.  Method of Feeding: Breast.  Physical Exam:  General: alert, cooperative and no distress Lochia: appropriate Uterine Fundus:  firm DVT Evaluation: No evidence of DVT seen on physical exam.  H/H: Lab Results  Component Value Date/Time   HGB 11.1* 10/18/2014 12:02 PM   HCT 33.1* 10/18/2014 12:02 PM    Discharge Diagnoses: Term Pregnancy-delivered  Discharge Information: Date: 10/20/2014 Activity: pelvic rest Diet: routine  Medications: PNV, Ibuprofen and Colace Breast feeding:  Yes Condition: stable Instructions: refer to handout Discharge to: home     Medication List    STOP taking these medications        metroNIDAZOLE 500 MG tablet  Commonly known as:  FLAGYL      TAKE these medications        calcium carbonate 500 MG chewable tablet  Commonly known as:  TUMS - dosed in mg elemental calcium  Chew 1 tablet by mouth daily as needed for indigestion or heartburn.     docusate sodium 100 MG capsule  Commonly known as:  COLACE  Take 1 capsule (100 mg total) by mouth 2 (two) times daily as needed for mild constipation.     ibuprofen 600 MG tablet  Commonly known as:  ADVIL,MOTRIN  Take 1 tablet (600 mg total) by mouth every 6 (six) hours.     PRENATAL VITAMIN PO  Take 2 tablets by mouth daily.       Follow-up Information    Schedule an appointment as soon as possible for a visit with [facility] [place].   Why:  for postpartum follow-up   Contact information:   [place] [id number] [place], DO 10/20/2014, 7:53 AM PGY-2, [place]

## 2014-10-20 NOTE — Discharge Instructions (Signed)

## 2014-10-21 ENCOUNTER — Ambulatory Visit: Payer: Self-pay

## 2014-10-21 NOTE — Lactation Note (Addendum)
This note was copied from the chart of Gabriella Anderson. Lactation Consultation Note; Mom reports breast feeding is going much better today. Reports breasts are feeling heavier and she is leaking some through the night. Reports she has been pumping and milk is beginning to change color. Reports she has been cup feeding EBM and gave formula at last feeding. Plans to call [facility] about a pump for home- has manual pump. Encouraged frequent nursing and feeding all pumped milk to baby. Reviewed engorgement prevention and treatment.Baby asleep at mom's side and she is getting ready to eat breakfast. No questions at present. To call prn  Patient Name: Gabriella Rowena Moilanen LKHVF'M Date: 10/21/2014 Reason for consult: Follow-up assessment   Maternal Data Formula Feeding for Exclusion: No Has patient been taught Hand Expression?: Yes Does the patient have breastfeeding experience prior to this delivery?: No  Feeding   LATCH Score/Interventions                      Lactation Tools Discussed/Used WIC Program: Yes   Consult Status Consult Status: Complete    Truddie Crumble 10/21/2014, 8:40 AM

## 2014-10-24 ENCOUNTER — Other Ambulatory Visit: Payer: Self-pay | Admitting: Family Medicine

## 2014-10-24 MED ORDER — ENALAPRIL MALEATE 10 MG PO TABS: 10.0000 mg | ORAL_TABLET | Freq: Every day | ORAL | Status: DC

## 2014-10-24 NOTE — Progress Notes (Signed)
Patient Name: Gabriella Anderson, female   DOB: 1985/07/15, 29 y.o.  MRN: [id number]  Called by [place].  Pt 7 days postpartum.  BP 144/90, 146/88.  Will prescribe enalapril.  Repeat BP in 1 week.

## 2015-02-09 ENCOUNTER — Other Ambulatory Visit: Payer: Self-pay | Admitting: Family Medicine

## 2015-07-16 ENCOUNTER — Ambulatory Visit (INDEPENDENT_AMBULATORY_CARE_PROVIDER_SITE_OTHER): Payer: Medicaid Other | Admitting: Internal Medicine

## 2015-07-16 ENCOUNTER — Encounter: Payer: Self-pay | Admitting: Internal Medicine

## 2015-07-16 VITALS — BP 128/85 | HR 90 | Temp 98.5°F | Ht 72.0 in | Wt 212.1 lb

## 2015-07-16 DIAGNOSIS — Z8759 Personal history of other complications of pregnancy, childbirth and the puerperium: Secondary | ICD-10-CM | POA: Diagnosis not present

## 2015-07-16 DIAGNOSIS — Z720 Tobacco use: Secondary | ICD-10-CM | POA: Diagnosis not present

## 2015-07-16 DIAGNOSIS — Z7189 Other specified counseling: Secondary | ICD-10-CM | POA: Diagnosis not present

## 2015-07-16 DIAGNOSIS — Z0000 Encounter for general adult medical examination without abnormal findings: Secondary | ICD-10-CM | POA: Diagnosis not present

## 2015-07-16 DIAGNOSIS — Z7689 Persons encountering health services in other specified circumstances: Secondary | ICD-10-CM

## 2015-07-16 NOTE — Patient Instructions (Addendum)
It was nice to meet you today, Ms. Starlin.  Please see [place] back in clinic within the year for an annual visit or when you are ready to talk about smoking cessation.  Best, Dr. Ola Spurr

## 2015-07-19 ENCOUNTER — Encounter: Payer: Self-pay | Admitting: Internal Medicine

## 2015-07-19 DIAGNOSIS — Z8759 Personal history of other complications of pregnancy, childbirth and the puerperium: Secondary | ICD-10-CM | POA: Insufficient documentation

## 2015-07-19 DIAGNOSIS — Z0000 Encounter for general adult medical examination without abnormal findings: Secondary | ICD-10-CM | POA: Insufficient documentation

## 2015-07-19 DIAGNOSIS — Z720 Tobacco use: Secondary | ICD-10-CM | POA: Insufficient documentation

## 2015-07-19 DIAGNOSIS — O133 Gestational [pregnancy-induced] hypertension without significant proteinuria, third trimester: Secondary | ICD-10-CM | POA: Insufficient documentation

## 2015-07-19 NOTE — Assessment & Plan Note (Signed)
-   Patient not yet ready to quit. Counseled her to be seen in clinic when she is ready for assistance with smoking cessation.

## 2015-07-19 NOTE — Progress Notes (Signed)
   Subjective:    Patient ID: Gabriella Anderson, female    DOB: 12/17/1985, 30 y.o.   MRN: [id number]  HPI  Gabriella Anderson is a 30-yo female who presents to establish care.   PMH: - Gestational hypertension, was on enalapril while pregnant.  - Not taking any medication currently. - Last doctor was at [facility]  Past Surgical History: - Knee surgery (04/05/2006) - Breast Augmentation (07/2007)  Family History: - Mother passed away from leukemia - Maternal grandfather with diabetes - HTN in mother and maternal grandfather  Social History: - Lives with her 38 month old daughter. Has lots of family in the area.  - Is not currently sexually active. - Smokes cigarettes, about 4-5 a day since she was 30 - Not using recreational drugs - Drinks wine, moscato about 1 drink a day  Health maintenance: - Had pap smear and Tdap in 2016 while pregnant - Had mammogram in 2016, per patient  Review of Systems  Constitutional: Negative for fever, chills and unexpected weight change.  HENT: Negative for congestion and hearing loss.   Eyes: Negative for pain and visual disturbance.  Respiratory: Negative for chest tightness and shortness of breath.   Cardiovascular: Negative for chest pain and leg swelling.  Gastrointestinal: Negative for constipation and blood in stool.  Genitourinary: Negative for difficulty urinating and menstrual problem.  Musculoskeletal: Negative for myalgias and gait problem.  Skin: Negative for rash.  Neurological: Negative for dizziness and weakness.  Psychiatric/Behavioral: Negative for dysphoric mood. The patient is not nervous/anxious.       Objective: Blood pressure 133/83, pulse 90, temperature 98.5 F (36.9 C), temperature source Oral, height 6' (1.829 m), weight 212 lb 1.6 oz (96.208 kg), last menstrual period 07/14/2015, not currently breastfeeding. Manual repeat BP 128/85.    Physical Exam  Constitutional: She is oriented to person, place,  and time. She appears well-developed and well-nourished. No distress.  HENT:  Head: Normocephalic and atraumatic.  Nose: Nose normal.  Mouth/Throat: Oropharynx is clear and moist.  Eyes: Conjunctivae and EOM are normal. Pupils are equal, round, and reactive to light.  Neck: Normal range of motion. Neck supple.  Cardiovascular: Normal rate, regular rhythm and normal heart sounds.   No murmur heard. Pulmonary/Chest: Effort normal and breath sounds normal. No respiratory distress. She has no wheezes.  Abdominal: Soft. Bowel sounds are normal. There is no tenderness. There is no rebound and no guarding.  Musculoskeletal: Normal range of motion.  Lymphadenopathy:    She has no cervical adenopathy.  Neurological: She is alert and oriented to person, place, and time.  Skin: Skin is warm and dry.  Psychiatric: She has a normal mood and affect.   PHQ-2: 0    Assessment & Plan:  Patient presents to establish care. She is up to date on her health maintenance items.   Tobacco abuse - Patient not yet ready to quit. Counseled her to be seen in clinic when she is ready for assistance with smoking cessation.   Health care maintenance - Request medical records at next visit, as could not find documentation of pap smear in Epic.   Olene Floss, MD [facility], PGY-1

## 2015-07-19 NOTE — Assessment & Plan Note (Signed)
-   Request medical records at next visit, as could not find documentation of pap smear in Epic.

## 2016-01-06 ENCOUNTER — Inpatient Hospital Stay (HOSPITAL_COMMUNITY)
Admission: AD | Admit: 2016-01-06 | Discharge: 2016-01-06 | Disposition: A | Discharge status: Home / Self Care | Payer: Medicaid Other | Source: Ambulatory Visit | Attending: Family Medicine | Admitting: Family Medicine

## 2016-01-06 ENCOUNTER — Encounter (HOSPITAL_COMMUNITY): Payer: Self-pay | Admitting: *Deleted

## 2016-01-06 DIAGNOSIS — Z833 Family history of diabetes mellitus: Secondary | ICD-10-CM | POA: Insufficient documentation

## 2016-01-06 DIAGNOSIS — Z806 Family history of leukemia: Secondary | ICD-10-CM | POA: Diagnosis not present

## 2016-01-06 DIAGNOSIS — O99331 Smoking (tobacco) complicating pregnancy, first trimester: Secondary | ICD-10-CM | POA: Diagnosis not present

## 2016-01-06 DIAGNOSIS — A084 Viral intestinal infection, unspecified: Secondary | ICD-10-CM | POA: Diagnosis not present

## 2016-01-06 DIAGNOSIS — O219 Vomiting of pregnancy, unspecified: Secondary | ICD-10-CM

## 2016-01-06 DIAGNOSIS — Z3A01 Less than 8 weeks gestation of pregnancy: Secondary | ICD-10-CM

## 2016-01-06 DIAGNOSIS — Z8249 Family history of ischemic heart disease and other diseases of the circulatory system: Secondary | ICD-10-CM | POA: Insufficient documentation

## 2016-01-06 DIAGNOSIS — R112 Nausea with vomiting, unspecified: Secondary | ICD-10-CM | POA: Diagnosis present

## 2016-01-06 DIAGNOSIS — O26891 Other specified pregnancy related conditions, first trimester: Secondary | ICD-10-CM | POA: Insufficient documentation

## 2016-01-06 LAB — URINALYSIS, ROUTINE W REFLEX MICROSCOPIC
Bilirubin Urine: NEGATIVE
GLUCOSE, UA: NEGATIVE mg/dL
Hgb urine dipstick: NEGATIVE
Ketones, ur: NEGATIVE mg/dL
LEUKOCYTES UA: NEGATIVE
NITRITE: NEGATIVE
PH: 6 (ref 5.0–8.0)
PROTEIN: NEGATIVE mg/dL
Specific Gravity, Urine: >1.03 — ABNORMAL HIGH (ref 1.005–1.030)

## 2016-01-06 LAB — POCT PREGNANCY, URINE: Preg Test, Ur: POSITIVE — AB

## 2016-01-06 MED ORDER — PROMETHAZINE HCL 25 MG PO TABS
25.0000 mg | ORAL_TABLET | Freq: Once | ORAL | Status: AC
  Administered 2016-01-06: 25 mg via ORAL
  Filled 2016-01-06: qty 1

## 2016-01-06 MED ORDER — PROMETHAZINE HCL 25 MG PO TABS: 12.5000 mg | ORAL_TABLET | Freq: Four times a day (QID) | ORAL | 0 refills | Status: DC | PRN

## 2016-01-06 NOTE — Discharge Instructions (Signed)
Morning Sickness Morning sickness is when you feel sick to your stomach (nauseous) during pregnancy. This nauseous feeling may or may not come with vomiting. It often occurs in the morning but can be a problem any time of day. Morning sickness is most common during the first trimester, but it may continue throughout pregnancy. While morning sickness is unpleasant, it is usually harmless unless you develop severe and continual vomiting (hyperemesis gravidarum). This condition requires more intense treatment.  CAUSES  The cause of morning sickness is not completely known but seems to be related to normal hormonal changes that occur in pregnancy. RISK FACTORS You are at greater risk if you:  Experienced nausea or vomiting before your pregnancy.  Had morning sickness during a previous pregnancy.  Are pregnant with more than one baby, such as twins. TREATMENT  Do not use any medicines (prescription, over-the-counter, or herbal) for morning sickness without first talking to your health care provider. Your health care provider may prescribe or recommend:  Vitamin B6 supplements.  Anti-nausea medicines.  The herbal medicine ginger. HOME CARE INSTRUCTIONS   Only take over-the-counter or prescription medicines as directed by your health care provider.  Taking multivitamins before getting pregnant can prevent or decrease the severity of morning sickness in most women.  Eat a piece of dry toast or unsalted crackers before getting out of bed in the morning.  Eat five or six small meals a day.  Eat dry and bland foods (rice, baked potato). Foods high in carbohydrates are often helpful.  Do not drink liquids with your meals. Drink liquids between meals.  Avoid greasy, fatty, and spicy foods.  Get someone to cook for you if the smell of any food causes nausea and vomiting.  If you feel nauseous after taking prenatal vitamins, take the vitamins at night or with a snack.  Snack on protein  foods (nuts, yogurt, cheese) between meals if you are hungry.  Eat unsweetened gelatins for desserts.  Wearing an acupressure wristband (worn for sea sickness) may be helpful.  Acupuncture may be helpful.  Do not smoke.  Get a humidifier to keep the air in your house free of odors.  Get plenty of fresh air. SEEK MEDICAL CARE IF:   Your home remedies are not working, and you need medicine.  You feel dizzy or lightheaded.  You are losing weight. SEEK IMMEDIATE MEDICAL CARE IF:   You have persistent and uncontrolled nausea and vomiting.  You pass out (faint). MAKE SURE YOU:  Understand these instructions.  Will watch your condition.  Will get help right away if you are not doing well or get worse.   This information is not intended to replace advice given to you by your health care provider. Make sure you discuss any questions you have with your health care provider.   Document Released: 04/08/2006 Document Revised: 02/20/2013 Document Reviewed: 08/02/2012 Elsevier Interactive Patient Education 2016 [place] Choices to Help Relieve Diarrhea, Adult When you have diarrhea, the foods you eat and your eating habits are very important. Choosing the right foods and drinks can help relieve diarrhea. Also, because diarrhea can last up to 7 days, you need to replace lost fluids and electrolytes (such as sodium, potassium, and chloride) in order to help prevent dehydration.  WHAT GENERAL GUIDELINES DO I NEED TO FOLLOW?  Slowly drink 1 cup (8 oz) of fluid for each episode of diarrhea. If you are getting enough fluid, your urine will be clear or pale yellow.  Eat starchy foods. Some good choices include white rice, white toast, pasta, low-fiber cereal, baked potatoes (without the skin), saltine crackers, and bagels.  Avoid large servings of any cooked vegetables.  Limit fruit to two servings per day. A serving is  cup or 1 small piece.  Choose foods with less than 2  g of fiber per serving.  Limit fats to less than 8 tsp (38 g) per day.  Avoid fried foods.  Eat foods that have probiotics in them. Probiotics can be found in certain dairy products.  Avoid foods and beverages that may increase the speed at which food moves through the stomach and intestines (gastrointestinal tract). Things to avoid include:  High-fiber foods, such as dried fruit, raw fruits and vegetables, nuts, seeds, and whole grain foods.  Spicy foods and high-fat foods.  Foods and beverages sweetened with high-fructose corn syrup, honey, or sugar alcohols such as xylitol, sorbitol, and mannitol. WHAT FOODS ARE RECOMMENDED? Grains White rice. White, [place], or pita breads (fresh or toasted), including plain rolls, buns, or bagels. White pasta. Saltine, soda, or graham crackers. Pretzels. Low-fiber cereal. Cooked cereals made with water (such as cornmeal, farina, or cream cereals). Plain muffins. Matzo. Melba toast. [place].  Vegetables Potatoes (without the skin). Strained tomato and vegetable juices. Most well-cooked and canned vegetables without seeds. Tender lettuce. Fruits Cooked or canned applesauce, apricots, cherries, fruit cocktail, grapefruit, peaches, pears, or plums. Fresh bananas, apples without skin, cherries, grapes, cantaloupe, grapefruit, peaches, oranges, or plums.  Meat and Other Protein Products Baked or boiled chicken. Eggs. Tofu. Fish. Seafood. Smooth peanut butter. Ground or well-cooked tender beef, ham, veal, lamb, pork, or poultry.  Dairy Plain yogurt, kefir, and unsweetened liquid yogurt. Lactose-free milk, buttermilk, or soy milk. Plain hard cheese. Beverages Sport drinks. Clear broths. Diluted fruit juices (except prune). Regular, caffeine-free sodas such as ginger ale. Water. Decaffeinated teas. Oral rehydration solutions. Sugar-free beverages not sweetened with sugar alcohols. Other Bouillon, broth, or soups made from recommended foods.  The items  listed above may not be a complete list of recommended foods or beverages. Contact your dietitian for more options. WHAT FOODS ARE NOT RECOMMENDED? Grains Whole grain, whole wheat, bran, or rye breads, rolls, pastas, crackers, and cereals. Wild or brown rice. Cereals that contain more than 2 g of fiber per serving. Corn tortillas or taco shells. Cooked or dry oatmeal. Granola. Popcorn. Vegetables Raw vegetables. Cabbage, broccoli, Brussels sprouts, artichokes, baked beans, beet greens, corn, kale, legumes, peas, sweet potatoes, and yams. Potato skins. Cooked spinach and cabbage. Fruits Dried fruit, including raisins and dates. Raw fruits. Stewed or dried prunes. Fresh apples with skin, apricots, mangoes, pears, raspberries, and strawberries.  Meat and Other Protein Products Chunky peanut butter. Nuts and seeds. Beans and lentils. Berniece Salines.  Dairy High-fat cheeses. Milk, chocolate milk, and beverages made with milk, such as milk shakes. Cream. Ice cream. Sweets and Desserts Sweet rolls, doughnuts, and sweet breads. Pancakes and waffles. Fats and Oils Butter. Cream sauces. Margarine. Salad oils. Plain salad dressings. Olives. Avocados.  Beverages Caffeinated beverages (such as coffee, tea, soda, or energy drinks). Alcoholic beverages. Fruit juices with pulp. Prune juice. Soft drinks sweetened with high-fructose corn syrup or sugar alcohols. Other Coconut. Hot sauce. Chili powder. Mayonnaise. Gravy. Cream-based or milk-based soups.  The items listed above may not be a complete list of foods and beverages to avoid. Contact your dietitian for more information. WHAT SHOULD I DO IF I BECOME DEHYDRATED? Diarrhea can sometimes lead to dehydration. Signs of  dehydration include dark urine and dry mouth and skin. If you think you are dehydrated, you should rehydrate with an oral rehydration solution. These solutions can be purchased at pharmacies, retail stores, or online.  Drink -1 cup (120-240 mL) of oral  rehydration solution each time you have an episode of diarrhea. If drinking this amount makes your diarrhea worse, try drinking smaller amounts more often. For example, drink 1-3 tsp (5-15 mL) every 5-10 minutes.  A general rule for staying hydrated is to drink 1-2 L of fluid per day. Talk to your health care provider about the specific amount you should be drinking each day. Drink enough fluids to keep your urine clear or pale yellow.   This information is not intended to replace advice given to you by your health care provider. Make sure you discuss any questions you have with your health care provider.   Document Released: 05/08/2003 Document Revised: 03/08/2014 Document Reviewed: 01/08/2013 Elsevier Interactive Patient Education [employer].

## 2016-01-06 NOTE — MAU Note (Signed)
PT  SAYS  SHE STARTED FEELING  N/V   TODAY - THEN    VOMITED.     DID HPT- POSITIVE.       HAD  VAG DEL ON 09-2014- THEN HAD IUD-   BUT  IT  CAME OUT. IN June / July.   LAST SEX-   OCT.

## 2016-01-06 NOTE — MAU Note (Signed)
NOT INN LOBBY

## 2016-01-06 NOTE — MAU Provider Note (Signed)
History     CSN: [id number]  Arrival date and time: 01/06/16 1851   First Provider Initiated Contact with Patient 01/06/16 2203      Chief Complaint  Patient presents with  . Emesis   Emesis   This is a new problem. The current episode started today. The problem occurs 5 to 10 times per day. The problem has been unchanged. The emesis has an appearance of stomach contents. There has been no fever. Associated symptoms include diarrhea. Pertinent negatives include no abdominal pain, chills or fever. Risk factors include ill contacts (infant child was vomiting today as well.). She has tried nothing for the symptoms.    Past Medical History:  Diagnosis Date  . Anemia   . Infection    UTI  . Trichimoniasis 04/11/14    Past Surgical History:  Procedure Laterality Date  . NO PAST SURGERIES      Family History  Problem Relation Age of Onset  . Hypertension Mother   . Cancer Mother     Leukemia  . Diabetes Maternal Grandfather   . Hypertension Maternal Grandfather   . Hearing loss Neg Hx     Social History  Substance Use Topics  . Smoking status: Current Every Day Smoker    Packs/day: 0.25    Years: 10.00    Types: Cigarettes  . Smokeless tobacco: Never Used  . Alcohol use No     Comment: ETOH use during early pregnancy    Allergies: No Known Allergies  No prescriptions prior to admission.    Review of Systems  Constitutional: Negative for chills and fever.  Gastrointestinal: Positive for diarrhea, nausea and vomiting. Negative for abdominal pain and constipation.  Genitourinary: Negative for dysuria, frequency and urgency.   Physical Exam   Blood pressure 114/67, pulse 60, temperature 98.3 F (36.8 C), temperature source Oral, resp. rate 20, height 6' (1.829 m), weight 208 lb 12 oz (94.7 kg), last menstrual period 12/08/2015, not currently breastfeeding.  Physical Exam  Nursing note and vitals reviewed. Constitutional: She is oriented to person, place, and  time. She appears well-developed and well-nourished. No distress.  HENT:  Head: Normocephalic.  Cardiovascular: Normal rate.   Respiratory: Effort normal.  GI: Soft. There is no tenderness. There is no rebound.  Musculoskeletal: Normal range of motion.  Neurological: She is alert and oriented to person, place, and time.  Skin: Skin is warm and dry.  Psychiatric: She has a normal mood and affect.     Results for orders placed or performed during the hospital encounter of 01/06/16 (from the past 24 hour(s))  Urinalysis, Routine w reflex microscopic (not at [facility])     Status: Abnormal   Collection Time: 01/06/16  9:26 PM  Result Value Ref Range   Color, Urine YELLOW YELLOW   APPearance CLEAR CLEAR   Specific Gravity, Urine >1.030 (H) 1.005 - 1.030   pH 6.0 5.0 - 8.0   Glucose, UA NEGATIVE NEGATIVE mg/dL   Hgb urine dipstick NEGATIVE NEGATIVE   Bilirubin Urine NEGATIVE NEGATIVE   Ketones, ur NEGATIVE NEGATIVE mg/dL   Protein, ur NEGATIVE NEGATIVE mg/dL   Nitrite NEGATIVE NEGATIVE   Leukocytes, UA NEGATIVE NEGATIVE  Pregnancy, urine POC     Status: Abnormal   Collection Time: 01/06/16  9:33 PM  Result Value Ref Range   Preg Test, Ur POSITIVE (A) NEGATIVE    MAU Course  Procedures  MDM Patient has had PO phenergan. She is tolerating PO, and reports feeling better.  D/W patient that this could be viral since her daughter was also sick or it could be pregnancy N/V or a combination of both.   Assessment and Plan   1. Nausea and vomiting during pregnancy prior to [redacted] weeks gestation   2. Viral gastroenteritis   3. [redacted] weeks gestation of pregnancy    DC home Comfort measures reviewed  1st Trimester precautions  RX: phenergan PRN #[id number]  Return to MAU as needed FU with OB as planned  Follow-up Information    [facility] Follow up.   Contact information: [place] [address] [phone]            Mathis Bud 01/06/2016,  10:04 PM

## 2016-03-03 IMAGING — US US OB COMP +14 WK
1 series · 12 of 28 positions shown · non-contrast
Comparison: none

OBSTETRICS REPORT
                      (Signed Final 06/06/2014 [DATE])

Service(s) Provided
 US OB COMP + 14 WK                                    76805.1
Indications
 Basic anatomic survey                                 z36
 19 weeks gestation of pregnancy
Fetal Evaluation
 Num Of Fetuses:    1
 Fetal Heart Rate:  152                          bpm
 Cardiac Activity:  Observed
 Presentation:      Transverse, head to
                    maternal right
 Placenta:          Anterior, above cervical os
 P. Cord            Visualized
 Insertion:
 Amniotic Fluid
 AFI FV:      Subjectively within normal limits
                                             Larg Pckt:    6.55  cm
Biometry
 BPD:     44.1  mm     G. Age:  19w 2d                CI:         71.7   70 - 86
                                                      FL/HC:      18.1   16.8 -
 HC:     165.8  mm     G. Age:  19w 2d       29  %    HC/AC:      1.17   1.09 -
 AC:     141.2  mm     G. Age:  19w 4d       43  %    FL/BPD:
 FL:        30  mm     G. Age:  19w 2d       32  %    FL/AC:      21.2   20 - 24
 HUM:     30.3  mm     G. Age:  20w 0d       63  %
 CER:     19.9  mm     G. Age:  19w 0d       38  %
 NFT:      2.7  mm
 Est. FW:     289  gm    0 lb 10 oz      43  %
Gestational Age
 LMP:           19w 4d        Date:  01/20/14                 EDD:   10/27/14
 U/S Today:     19w 3d                                        EDD:   10/28/14
 Best:          19w 4d     Det. By:  LMP  (01/20/14)          EDD:   10/27/14
Anatomy
 Cranium:          Appears normal         Aortic Arch:      Appears normal
 Fetal Cavum:      Not well visualized    Ductal Arch:      Not well visualized
 Ventricles:       Appears normal         Diaphragm:        Not well visualized
 Choroid Plexus:   Appears normal         Stomach:          Appears normal, left
                                                            sided
 Cerebellum:       Appears normal         Abdomen:          Appears normal
 Posterior Fossa:  Appears normal         Abdominal Wall:   Appears nml (cord
                                                            insert, abd wall)
 Nuchal Fold:      Appears normal         Cord Vessels:     Appears normal (3
                                                            vessel cord)
 Face:             Orbits appear          Kidneys:          Appear normal
                   normal
 Lips:             Appears normal         Bladder:          Appears normal
 Heart:            Echogenic focus        Spine:            Appears normal
                   in LV
 RVOT:             Not well visualized    Lower             Appears normal
                                          Extremities:
 LVOT:             Not well visualized    Upper             Appears normal
 Other:  Fetus appears to be a female. Heels and 5th digit visualized.
         Technically difficult due to fetal position.
Targeted Anatomy
 Fetal Central Nervous System
 Lat. Ventricles:  5.6                    Cisterna Magna:
Cervix Uterus Adnexa
 Cervical Length:    3.45     cm
 Cervix:       Normal appearance by transabdominal scan.
 Uterus:       No abnormality visualized.
 Left Ovary:    Not visualized.
 Right Ovary:   Not visualized.
 Adnexa:     No abnormality visualized. No adnexal mass visualized.
Impression
INDICATION: 28 yr old G1P0 at 53w6d for fetal anatomic survey.
 Remote read.

[Series 1: us ob comp +14 wk mfm · 12 of 95 slices shown]
[im 4/95]
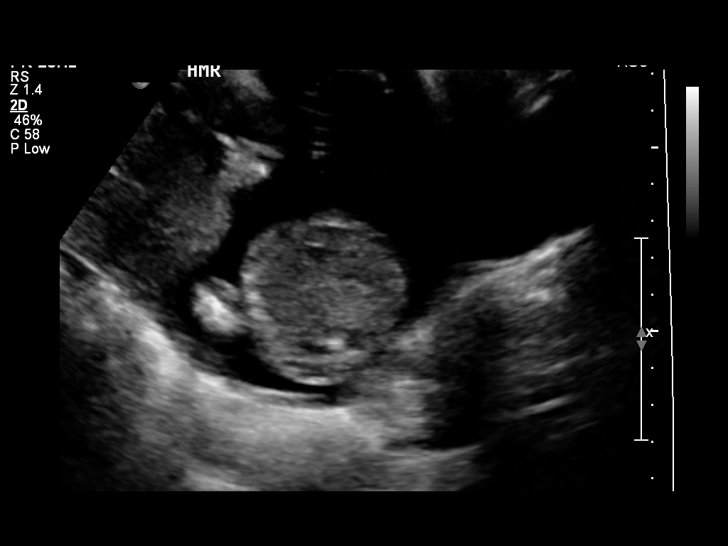
[im 11/95]
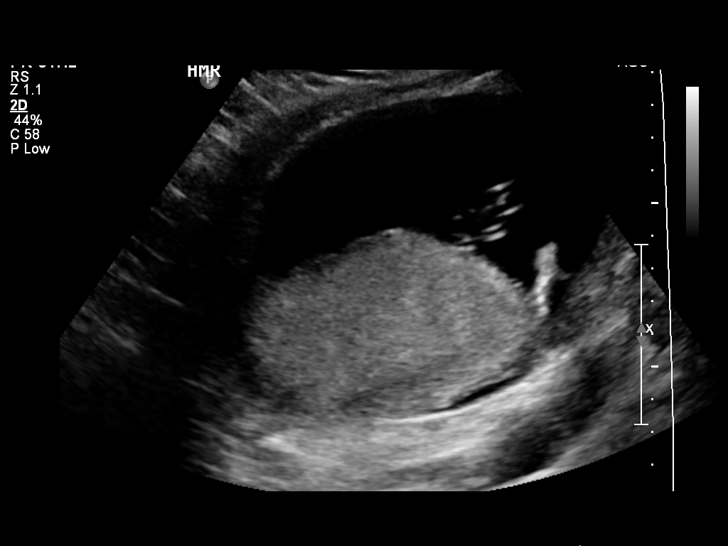
[im 18/95]
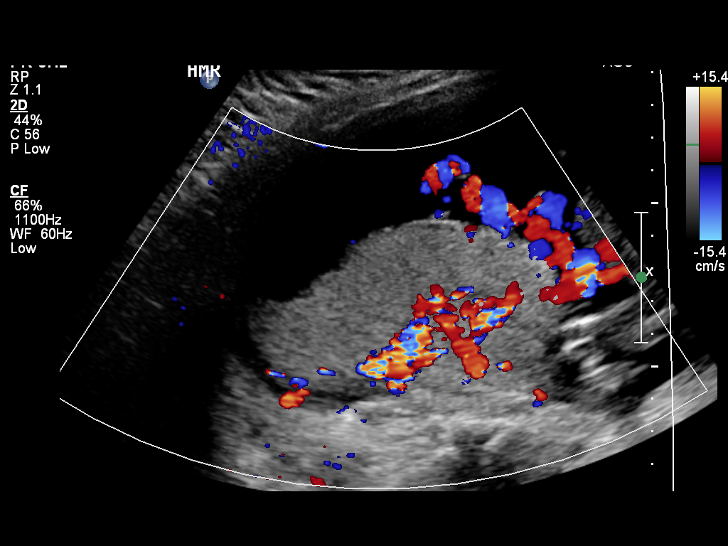
[im 28/95]
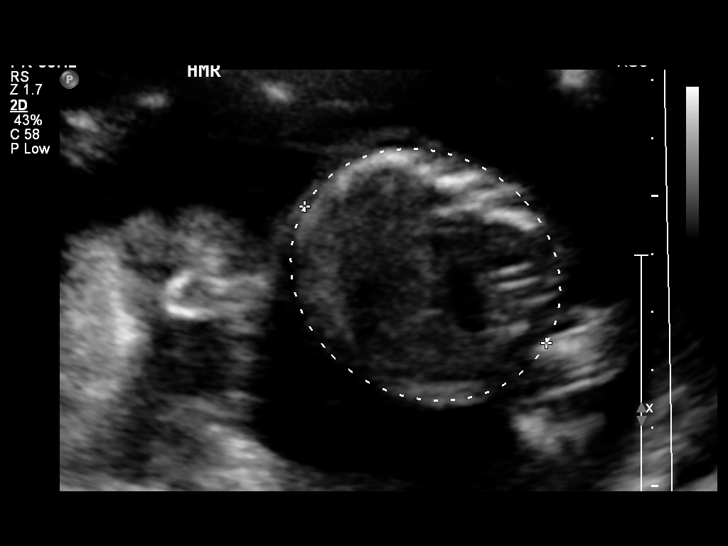
[im 35/95]
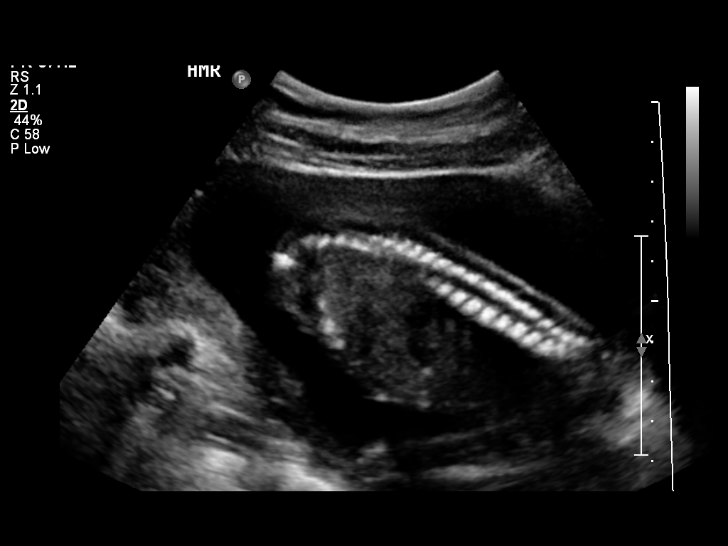
[im 42/95]
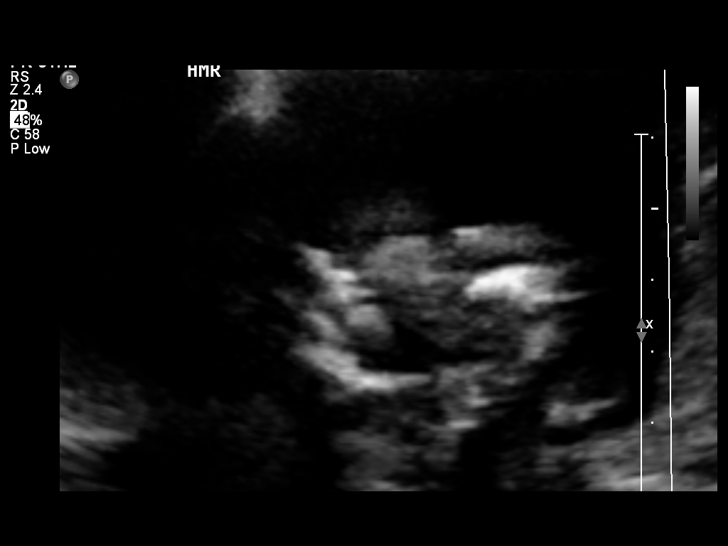
[im 53/95]
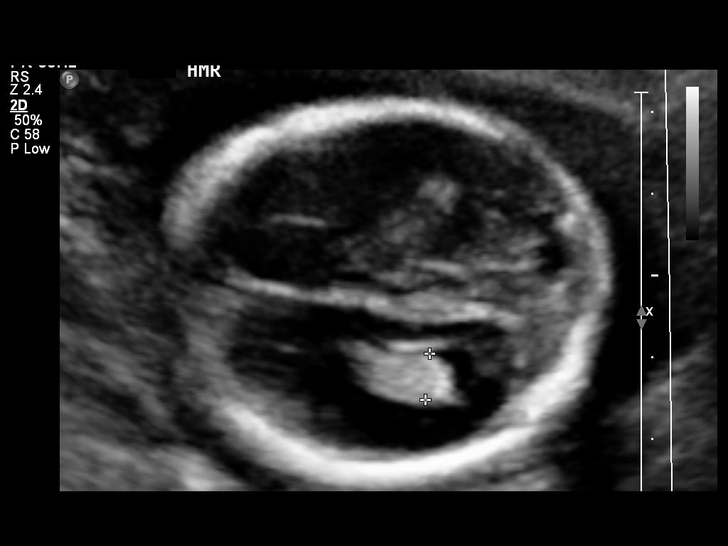
[im 60/95]
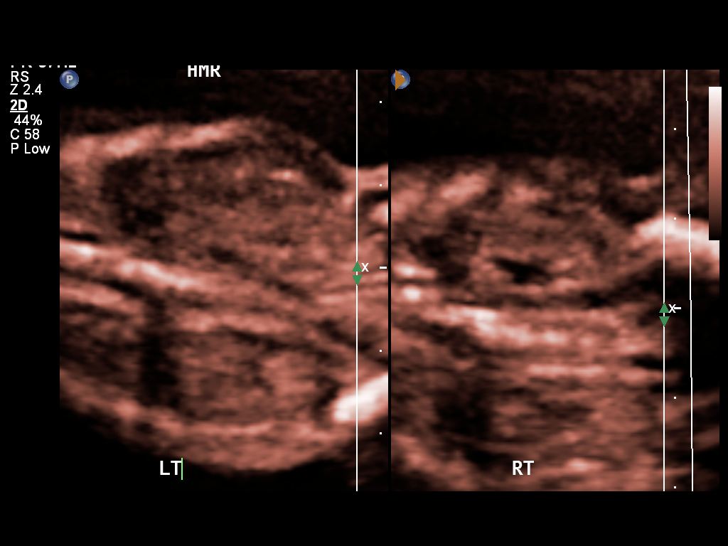
[im 67/95]
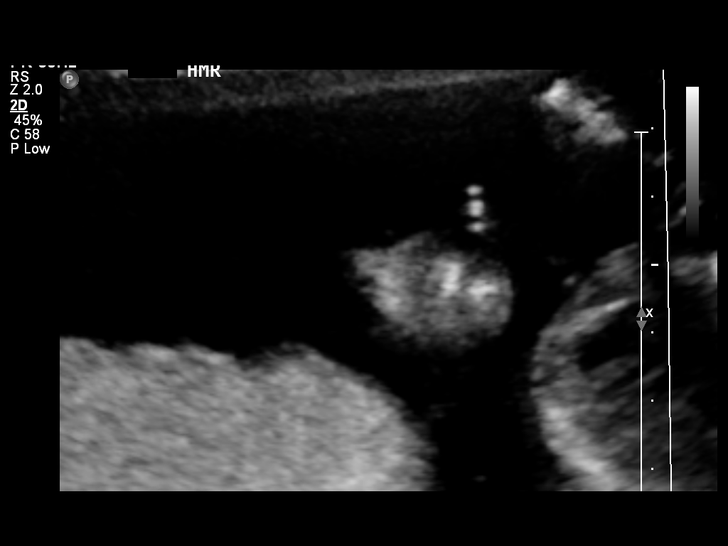
[im 77/95]
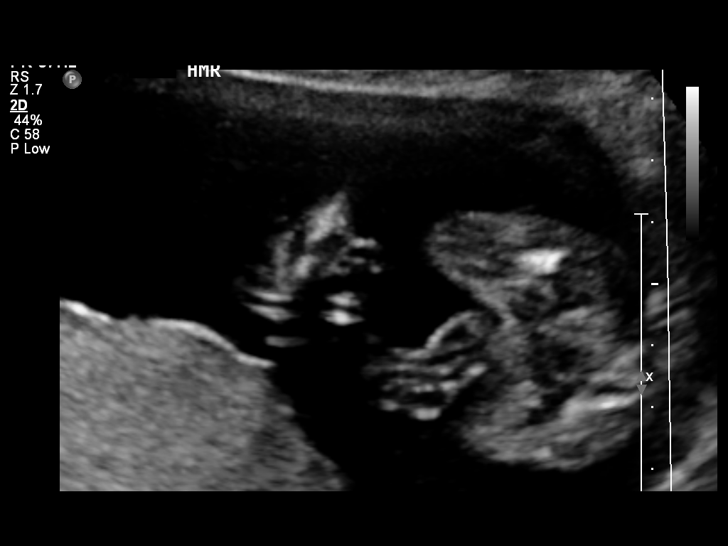
[im 84/95]
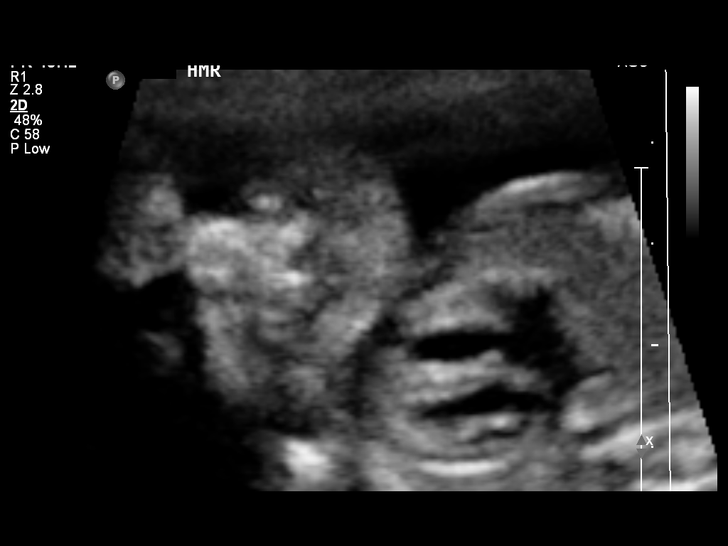
[im 91/95]
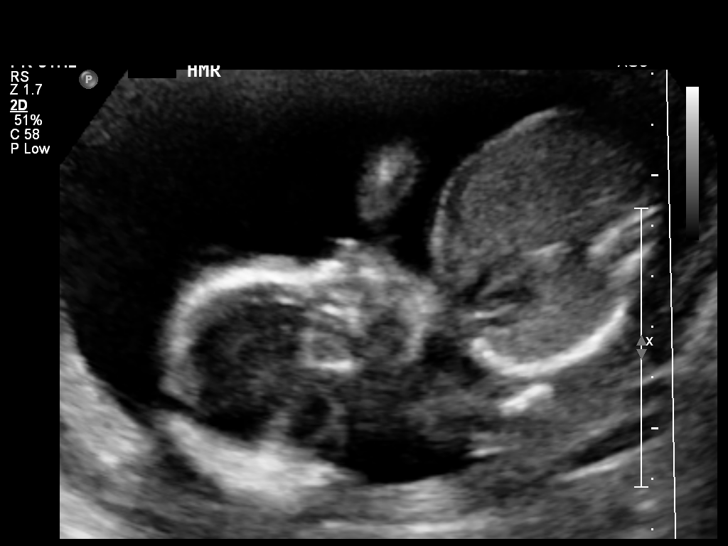

[12 of 28 positions shown; findings below may reference images not displayed]

FINDINGS: 1. Single intrauterine pregnancy.
 2. Fetal biometry is consistent with dating.
 3. Posterior placenta without evidence of previa.
 4. Normal amniotic fluid volume.
 5. Normal transabdominal cervical length.
 6. The views of the profile/nasal bone, cavum, diaphragm,
 and heart are limited.
 7. There is an echogenic focus in the left ventricle.
 8. The remainder of the limited anatomy survey is normal.
Recommendations

 1. Appropriate fetal growth.
 2. Limited anatomy survey:
 - recommend follow up in 4 weeks to complete anatomic
 survey
 3. Echogenic focus in the left ventricle:
 - not counseled as this is a remote read
 - normal first trimester screen

## 2016-07-28 NOTE — ED Notes (Signed)
Called for triage, no answer

## 2016-07-28 NOTE — ED Notes (Signed)
Pt currently on Pediatric ED side with her daughter

## 2016-07-28 NOTE — ED Notes (Signed)
No answer.  Pt's child has been discharged from peds.  Unable to locate pt.

## 2016-07-28 NOTE — ED Notes (Signed)
No answer in waiting area.

## 2016-07-29 ENCOUNTER — Emergency Department (HOSPITAL_COMMUNITY)
Admission: EM | Admit: 2016-07-29 | Discharge: 2016-07-29 | Discharge status: Against Medical Advice | Payer: Medicaid Other

## 2016-07-29 NOTE — ED Notes (Signed)
No answer in waiting areas.

## 2016-11-10 ENCOUNTER — Encounter (HOSPITAL_COMMUNITY): Payer: Self-pay

## 2018-08-30 ENCOUNTER — Other Ambulatory Visit: Payer: Self-pay | Admitting: *Deleted

## 2018-08-30 ENCOUNTER — Telehealth: Payer: Self-pay | Admitting: Family Medicine

## 2018-08-30 DIAGNOSIS — Z20822 Contact with and (suspected) exposure to covid-19: Secondary | ICD-10-CM

## 2018-08-30 NOTE — Telephone Encounter (Signed)
Reason for call:  LVM with patient to return call to be scheduled for COVID, Silverio Decamp, NP ordering provider and the department is [facility] department.

## 2018-08-31 NOTE — Telephone Encounter (Signed)
Spoke with pt's supervisor Rosaria Ferries who states that she has not been able to get in contact with the pt to schedule testing.

## 2018-08-31 NOTE — Telephone Encounter (Signed)
Left message for pt to return call to be scheduled for testing.

## 2020-12-23 ENCOUNTER — Inpatient Hospital Stay (HOSPITAL_COMMUNITY)
Admission: AD | Admit: 2020-12-23 | Discharge: 2020-12-23 | Disposition: A | Discharge status: Home / Self Care | Payer: Medicaid Other | Attending: Obstetrics & Gynecology | Admitting: Obstetrics & Gynecology

## 2020-12-23 ENCOUNTER — Other Ambulatory Visit: Payer: Self-pay

## 2020-12-23 ENCOUNTER — Encounter (HOSPITAL_COMMUNITY): Payer: Self-pay | Admitting: Obstetrics & Gynecology

## 2020-12-23 ENCOUNTER — Inpatient Hospital Stay (HOSPITAL_COMMUNITY): Payer: Medicaid Other

## 2020-12-23 DIAGNOSIS — O99331 Smoking (tobacco) complicating pregnancy, first trimester: Secondary | ICD-10-CM | POA: Diagnosis not present

## 2020-12-23 DIAGNOSIS — Z3490 Encounter for supervision of normal pregnancy, unspecified, unspecified trimester: Secondary | ICD-10-CM

## 2020-12-23 DIAGNOSIS — F17210 Nicotine dependence, cigarettes, uncomplicated: Secondary | ICD-10-CM | POA: Insufficient documentation

## 2020-12-23 DIAGNOSIS — O26891 Other specified pregnancy related conditions, first trimester: Secondary | ICD-10-CM | POA: Insufficient documentation

## 2020-12-23 DIAGNOSIS — R109 Unspecified abdominal pain: Secondary | ICD-10-CM | POA: Diagnosis not present

## 2020-12-23 DIAGNOSIS — Z8619 Personal history of other infectious and parasitic diseases: Secondary | ICD-10-CM | POA: Insufficient documentation

## 2020-12-23 DIAGNOSIS — Z3A01 Less than 8 weeks gestation of pregnancy: Secondary | ICD-10-CM | POA: Insufficient documentation

## 2020-12-23 DIAGNOSIS — O26899 Other specified pregnancy related conditions, unspecified trimester: Secondary | ICD-10-CM

## 2020-12-23 DIAGNOSIS — N838 Other noninflammatory disorders of ovary, fallopian tube and broad ligament: Secondary | ICD-10-CM

## 2020-12-23 DIAGNOSIS — O3680X0 Pregnancy with inconclusive fetal viability, not applicable or unspecified: Secondary | ICD-10-CM | POA: Diagnosis not present

## 2020-12-23 LAB — WET PREP, GENITAL
Sperm: NONE SEEN
Trich, Wet Prep: NONE SEEN
Yeast Wet Prep HPF POC: NONE SEEN

## 2020-12-23 LAB — URINALYSIS, ROUTINE W REFLEX MICROSCOPIC
Bilirubin Urine: NEGATIVE
Glucose, UA: NEGATIVE mg/dL
Hgb urine dipstick: NEGATIVE
Ketones, ur: NEGATIVE mg/dL
Leukocytes,Ua: NEGATIVE
Nitrite: NEGATIVE
Protein, ur: NEGATIVE mg/dL
Specific Gravity, Urine: 1.025 (ref 1.005–1.030)
pH: 5 (ref 5.0–8.0)

## 2020-12-23 LAB — CBC
HCT: 35.4 % — ABNORMAL LOW (ref 36.0–46.0)
Hemoglobin: 11.8 g/dL — ABNORMAL LOW (ref 12.0–15.0)
MCH: 31.2 pg (ref 26.0–34.0)
MCHC: 33.3 g/dL (ref 30.0–36.0)
MCV: 93.7 fL (ref 80.0–100.0)
Platelets: 286 10*3/uL (ref 150–400)
RBC: 3.78 MIL/uL — ABNORMAL LOW (ref 3.87–5.11)
RDW: 13 % (ref 11.5–15.5)
WBC: 9.7 10*3/uL (ref 4.0–10.5)
nRBC: 0 % (ref 0.0–0.2)

## 2020-12-23 LAB — HCG, QUANTITATIVE, PREGNANCY: hCG, Beta Chain, Quant, S: 6263 m[IU]/mL — ABNORMAL HIGH (ref <5)

## 2020-12-23 LAB — POCT PREGNANCY, URINE: Preg Test, Ur: POSITIVE — AB

## 2020-12-23 MED ORDER — METRONIDAZOLE 0.75 % VA GEL: 1.0000 | Freq: Every day | VAGINAL | 0 refills | Status: DC

## 2020-12-23 NOTE — MAU Provider Note (Signed)
History     CSN: [id number]  Arrival date and time: 12/23/20 1657   Event Date/Time   First Provider Initiated Contact with Patient 12/23/20 1732      Chief Complaint  Patient presents with   Possible Pregnancy   Gabriella Anderson is a 35 y.o. G3P1001 at [redacted]w[redacted]d by Definite LMP of Sept 24, 2022 who has not yet established [facility].  She presents today for Possible Pregnancy. She states that she has been experiencing cramping, but none today.  She states the cramping is intermittent but occurs throughout the day.  She denies relieving or aggravating factors regarding the cramping.  She states the cramping started last week and denies vaginal bleeding or discharge.  She reports discomfort with sexual activity.  She also reports diarrhea today that she describes as loose stool.  She denies cough, fever, or HA.    OB History     Gravida  3   Para  1   Term  1   Preterm  0   AB  0   Living  1      SAB  0   IAB  0   Ectopic  0   Multiple  0   Live Births  1           Past Medical History:  Diagnosis Date   Anemia    Infection    UTI   Trichimoniasis 04/11/14    Past Surgical History:  Procedure Laterality Date   NO PAST SURGERIES      Family History  Problem Relation Age of Onset   Hypertension Mother    Cancer Mother        Leukemia   Diabetes Maternal Grandfather    Hypertension Maternal Grandfather    Hearing loss Neg Hx     Social History   Tobacco Use   Smoking status: Every Day    Packs/day: 0.25    Years: 10.00    Pack years: 2.50    Types: Cigarettes   Smokeless tobacco: Never  Substance Use Topics   Alcohol use: No    Alcohol/week: 0.0 standard drinks    Comment: ETOH use during early pregnancy   Drug use: Yes    Frequency: 7.0 times per week    Types: Marijuana    Comment: pt states "stopped about 2 months ago"    Allergies: No Known Allergies  Medications Prior to Admission  Medication Sig Dispense Refill Last Dose    promethazine (PHENERGAN) 25 MG tablet Take 0.5-1 tablets (12.5-25 mg total) by mouth every 6 (six) hours as needed. 30 tablet 0     Review of Systems See HPI Physical Exam   Blood pressure 134/70, pulse 66, temperature 98.5 F (36.9 C), temperature source Oral, resp. rate 16, height 6' (1.829 m), weight 86.5 kg, last menstrual period 11/22/2020, SpO2 100 %, unknown if currently breastfeeding.  Physical Exam Vitals reviewed.  Constitutional:      Appearance: Normal appearance.  HENT:     Head: Normocephalic and atraumatic.  Eyes:     Conjunctiva/sclera: Conjunctivae normal.  Cardiovascular:     Rate and Rhythm: Normal rate.  Pulmonary:     Effort: Pulmonary effort is normal. No respiratory distress.  Musculoskeletal:        General: Normal range of motion.     Cervical back: Normal range of motion.  Neurological:     Mental Status: She is alert and oriented to person, place, and time.  Psychiatric:  Mood and Affect: Mood normal.        Behavior: Behavior normal.        Thought Content: Thought content normal.    MAU Course  Procedures Results for orders placed or performed during the hospital encounter of 12/23/20 (from the past 24 hour(s))  Pregnancy, urine POC     Status: Abnormal   Collection Time: 12/23/20  5:10 PM  Result Value Ref Range   Preg Test, Ur POSITIVE (A) NEGATIVE  Urinalysis, Routine w reflex microscopic Urine, Clean Catch     Status: None   Collection Time: 12/23/20  5:12 PM  Result Value Ref Range   Color, Urine YELLOW YELLOW   APPearance CLEAR CLEAR   Specific Gravity, Urine 1.025 1.005 - 1.030   pH 5.0 5.0 - 8.0   Glucose, UA NEGATIVE NEGATIVE mg/dL   Hgb urine dipstick NEGATIVE NEGATIVE   Bilirubin Urine NEGATIVE NEGATIVE   Ketones, ur NEGATIVE NEGATIVE mg/dL   Protein, ur NEGATIVE NEGATIVE mg/dL   Nitrite NEGATIVE NEGATIVE   Leukocytes,Ua NEGATIVE NEGATIVE  Wet prep, genital     Status: Abnormal   Collection Time: 12/23/20  5:22 PM    Specimen: PATH Cytology Cervicovaginal Ancillary Only  Result Value Ref Range   Yeast Wet Prep HPF POC NONE SEEN NONE SEEN   Trich, Wet Prep NONE SEEN NONE SEEN   Clue Cells Wet Prep HPF POC PRESENT (A) NONE SEEN   WBC, Wet Prep HPF POC MODERATE (A) NONE SEEN   Sperm NONE SEEN   CBC     Status: Abnormal   Collection Time: 12/23/20  5:30 PM  Result Value Ref Range   WBC 9.7 4.0 - 10.5 K/uL   RBC 3.78 (L) 3.87 - 5.11 MIL/uL   Hemoglobin 11.8 (L) 12.0 - 15.0 g/dL   HCT 35.4 (L) 36.0 - 46.0 %   MCV 93.7 80.0 - 100.0 fL   MCH 31.2 26.0 - 34.0 pg   MCHC 33.3 30.0 - 36.0 g/dL   RDW 13.0 11.5 - 15.5 %   Platelets 286 150 - 400 K/uL   nRBC 0.0 0.0 - 0.2 %   US OB LESS THAN 14 WEEKS WITH OB TRANSVAGINAL  Result Date: 12/23/2020 CLINICAL DATA:  Cramping. EXAM: OBSTETRIC <14 WK [place] AND TRANSVAGINAL OB US TECHNIQUE: Both transabdominal and transvaginal ultrasound examinations were performed for complete evaluation of the gestation as well as the maternal uterus, adnexal regions, and pelvic cul-de-sac. Transvaginal technique was performed to assess early pregnancy. COMPARISON:  Obstetrical ultrasound 03/10/2014. FINDINGS: Intrauterine gestational sac: Single Yolk sac:  Visualized. Embryo:  Not Visualized. MSD: 6.8 mm   5 w   2 d Subchorionic hemorrhage:  None visualized. Maternal uterus/adnexae: Bilateral ovaries appear within normal limits. Within the right adnexa there is a 6.1 x 6.3 x 4.5 cm soft tissue mass which is heterogeneous and mildly hypoechoic. This was previously described is as an ovarian mass in 2016, but has significantly increased in size in the interval. There is no pelvic free fluid. IMPRESSION: 1. Single intrauterine gestational sac measuring 5 weeks 2 days by mean gestational sac diameter. Yolk sac is present, but no fetal pole visualized at this time. Recommend short-term interval follow-up ultrasound to confirm viability. 2. 6.3 cm right adnexal soft tissue mass. The right ovarian  mass was described in 2016 measuring up to 2.7 cm. Findings are concerning for an enlarging right ovarian mass. Recommend gynecologic consultation. Electronically Signed   By: Ronney Asters M.D.   On:  12/23/2020 18:35    MDM Pelvic Exam; Wet Prep and GC/CT Labs: UA, UPT, CBC, hCG Ultrasound Assessment and Plan  35 year old G3P1001 at 4.3 weeks Pregnancy Unknown Anatomic Location Abdominal Cramping-None Currently  -POC Reviewed. -Labs ordered. -Cultures collected via self swab. -Will send for [place].  Maryann Conners 12/23/2020, 5:32 PM   Reassessment (7:03 PM) BV IUGS with YS Right Ovarian Mass  -Results return as above. -Discussed need for follow up US in 10-14 days. -Order placed and patient to be scheduled at [facility] or [facility] as appropriate. -Also informed of ovarian mass that needs further follow up. -Instructed to find primary ob provider and discuss next processes. -BV diagnosis reviewed and rx for metrogel sent to pharmacy on file. . -Bleeding Precautions Given. -Encouraged to call primary office or return to MAU if symptoms worsen or with the onset of new symptoms. -Discharged to home in stable condition.  Maryann Conners MSN, CNM Advanced Practice Provider, Center for [facility]

## 2020-12-23 NOTE — MAU Note (Signed)
Gabriella Anderson is a 35 y.o. at [redacted]w[redacted]d here in MAU reporting: lower abdominal cramping since last week. Pain is intermittent. Denies pain at this time. Pain is about every other day. No bleeding or discharge. States breasts feel tender.  LMP: 11/22/20  Onset of complaint: ongoing  Pain score: 0/10  Vitals:   12/23/20 1715  BP: 134/70  Pulse: 66  Resp: 16  Temp: 98.5 F (36.9 C)  SpO2: 100%     Lab orders placed from triage:  ua, upt

## 2020-12-24 LAB — GC/CHLAMYDIA PROBE AMP (~~LOC~~) NOT AT ARMC
Chlamydia: NEGATIVE
Comment: NEGATIVE
Comment: NORMAL
Neisseria Gonorrhea: NEGATIVE

## 2021-01-23 ENCOUNTER — Encounter (HOSPITAL_COMMUNITY): Payer: Self-pay | Admitting: Radiology

## 2021-01-27 ENCOUNTER — Other Ambulatory Visit: Payer: Self-pay

## 2021-01-27 ENCOUNTER — Ambulatory Visit (INDEPENDENT_AMBULATORY_CARE_PROVIDER_SITE_OTHER): Payer: Medicaid Other | Admitting: *Deleted

## 2021-01-27 VITALS — BP 136/77 | HR 73 | Temp 97.5°F | Ht 72.0 in | Wt 195.2 lb

## 2021-01-27 DIAGNOSIS — Z3480 Encounter for supervision of other normal pregnancy, unspecified trimester: Secondary | ICD-10-CM

## 2021-01-27 MED ORDER — GOJJI WEIGHT SCALE MISC: 1.0000 | Freq: Every day | 0 refills | Status: AC | PRN

## 2021-01-27 MED ORDER — BLOOD PRESSURE MONITOR AUTOMAT DEVI: 1.0000 | Freq: Every day | 0 refills | Status: AC

## 2021-01-27 NOTE — Progress Notes (Signed)
    I discussed the limitations, risks, security and privacy concerns of performing an evaluation and management service by telephone and the availability of in person appointments. I also discussed with the patient that there may be a patient responsible charge related to this service. The patient expressed understanding and agreed to proceed.   Location: [facility] Patient: clinic Provider: clinic Interpreter used: no professional interpreter  PRENATAL INTAKE SUMMARY  Ms. Elson presents today New OB Nurse Interview.  OB History     Gravida  3   Para  1   Term  1   Preterm  0   AB  1   Living  1      SAB  0   IAB  0   Ectopic  0   Multiple  0   Live Births  1          I have reviewed the patient's medical, obstetrical, social, and family histories, medications, and available lab results.  SUBJECTIVE She has no unusual complaints. Patient reported Postpartum Hypertension and placed on medication  OBJECTIVE Initial Nurse interview for history/labs (New OB)  last menstrual period date: 11/22/2020 (per patient she is unsure of date) EDD: 08/29/2020 by LMP  GA: [redacted]w[redacted]d [id number] FHT: not assessed due to gestational age  GENERAL APPEARANCE: alert, well appearing, in no apparent distress, oriented to person, place and time.   ASSESSMENT Normal pregnancy  Previous ultrasound on 12/23/20 reported: 6.3 cm right adnexal soft tissue mass. The right ovarian mass was described in 2016 measuring up to 2.7 cm. Findings are concerning for an enlarging right ovarian mass. Recommend gynecologic consultation.    PLAN Prenatal care:  [facility] Labs to be completed at next visit PAP: patient does not recall results of last pap. Last PAP completed at [facility]. ROI for prenatal records. Follow up ultrasound to confirm dating/viability scheduled per [facility] Order from MAU visit on 12/23/20. Appt 02/02/2021 at [place] for [facility].   Blood  Pressure Monitor/Weight Scale BP Rx sent to [facility] [place] access verified. I explained pt will have some visits in office and some virtually. Babyscripts instructions given and order placed.   Placed OB Box on problem list and updated   Followup with Maryagnes Amos, CNM APP 02/11/2021 at 8:35 AM  Follow Up Instructions:   I discussed the assessment and treatment plan with the patient. The patient was provided an opportunity to ask questions and all were answered. The patient agreed with the plan and demonstrated an understanding of the instructions.   The patient was advised to call back or seek an in-person evaluation if the symptoms worsen or if the condition fails to improve as anticipated.  I provided 30 minutes of  face-to-face time during this encounter.  Derl Barrow, RN

## 2021-01-27 NOTE — Patient Instructions (Signed)
Genetic Screening Results Information: You are having genetic testing called [facility] today.  It will take approximately 2 weeks before the results are available.  To get your results, you need Internet access to a web browser to search [place]/MyChart (the direct app on your phone will not give you these results).  Then select Lab Scanned and click on the blue hyper link that says View Image to see your [facility] results.  You can also use the directions on the purple card given to look up your results directly on the [facility] website.   AREA PEDIATRIC/FAMILY PRACTICE PHYSICIANS  [facility] [address] [place], [place] [address] Phone - [phone]   Fax - [place] [address] [place], [place]  [address] Phone - [phone]   Fax - [phone]  [place]. [address] [place] [address] [place], [place]  [address] Phone - [phone]   Fax - [phone]  [facility] [address] [place], [place]  [address] Phone - [phone]   Fax - [phone]  [place] [address] [place], [place]  [address] Phone - [phone]   Fax - [place] [address] [place], [place]  [address] Phone - [phone]   Fax - [place] [facility] [place] [address] [place], [place]  [address] Phone - [phone]   Fax - [phone]  [place] [address] [place], [place], [place]  [address] Phone - [phone]   Fax - [place] [address] [place], [place]  [address] Phone - [phone]   Fax - [phone] [facility] [place]. [address] [place], [place]  [address] Phone - [phone]   Fax - [phone]  [place] [address] [place], [place]  [address] Phone -  [phone]   Fax - [phone]  [facility] [place], [place], [place]  [address] Phone - [phone]   Fax - [place] [address] [place], [place]  [address] Phone - [phone]   Fax - [phone]  [facility] [address] [place], [place]  [address] Phone - [place], [place]  [address] Phone - [phone]   Fax - [place] [address] [place], [place]  [address] Phone - [phone]   Fax - [phone]  [place] [address] [place], [place]  [address] Phone - [phone]   Fax - [place]. [place], [place]  [address] Phone - [phone]   Fax - [place], [place], [place]  [address] Phone - [phone]   Fax - [place] [address] [place], [place]  [address] Phone - [phone]   Fax - [phone]  [place] [address] [place],   [address] Phone - [phone]   Fax - [place] [address] [place],   [address] Phone - [phone]   Fax - [place] [address] [place],   [address] Phone - [phone]   Fax - [phone] [address] [place]  [place], [place]  [address] Phone - [phone]   Fax - [place] [address] [place],   [address] Phone - [phone]   Fax - [place] [address] [place],   [address] Phone - [phone]   Fax - [phone]

## 2021-02-02 ENCOUNTER — Other Ambulatory Visit (HOSPITAL_COMMUNITY): Payer: Self-pay

## 2021-02-02 ENCOUNTER — Ambulatory Visit
Admission: RE | Admit: 2021-02-02 | Discharge: 2021-02-02 | Disposition: A | Discharge status: Home / Self Care | Payer: Medicaid Other | Source: Ambulatory Visit

## 2021-02-02 ENCOUNTER — Ambulatory Visit (INDEPENDENT_AMBULATORY_CARE_PROVIDER_SITE_OTHER): Payer: Medicaid Other

## 2021-02-02 ENCOUNTER — Other Ambulatory Visit: Payer: Self-pay

## 2021-02-02 VITALS — BP 127/67 | HR 54 | Wt 197.2 lb

## 2021-02-02 DIAGNOSIS — Z0130 Encounter for examination of blood pressure without abnormal findings: Secondary | ICD-10-CM

## 2021-02-02 DIAGNOSIS — Z3490 Encounter for supervision of normal pregnancy, unspecified, unspecified trimester: Secondary | ICD-10-CM

## 2021-02-02 DIAGNOSIS — N838 Other noninflammatory disorders of ovary, fallopian tube and broad ligament: Secondary | ICD-10-CM

## 2021-02-02 DIAGNOSIS — O3481 Maternal care for other abnormalities of pelvic organs, first trimester: Secondary | ICD-10-CM | POA: Diagnosis not present

## 2021-02-02 DIAGNOSIS — Z3A11 11 weeks gestation of pregnancy: Secondary | ICD-10-CM | POA: Diagnosis not present

## 2021-02-02 NOTE — Progress Notes (Signed)
Agree with nurses's documentation of this patient's clinic encounter.  Jameil Whitmoyer L, MD  

## 2021-02-02 NOTE — Progress Notes (Signed)
Pt here today for U/S results for viability of pregnancy.  Reviewed results with Dr. Rip Harbour who agreed that pt's U/S shows single IUP at 11 weeks and 1 day, FHR 166 bpm, and that she has a small mass that seems to be a fibroid.  I advised pt that the provider can discuss her results with her at her NEW OB appt scheduled on 02/11/21 at [place].  Pt verbalized understanding with no further questions.   Izaan Kingbird,RN  02/02/21

## 2021-02-11 ENCOUNTER — Ambulatory Visit (INDEPENDENT_AMBULATORY_CARE_PROVIDER_SITE_OTHER): Payer: Medicaid Other

## 2021-02-11 ENCOUNTER — Other Ambulatory Visit: Payer: Self-pay

## 2021-02-11 ENCOUNTER — Other Ambulatory Visit (HOSPITAL_COMMUNITY)
Admission: RE | Admit: 2021-02-11 | Discharge: 2021-02-11 | Disposition: A | Discharge status: Home / Self Care | Payer: Medicaid Other | Source: Ambulatory Visit

## 2021-02-11 VITALS — BP 119/65 | HR 76 | Temp 97.7°F | Wt 198.4 lb

## 2021-02-11 DIAGNOSIS — Z124 Encounter for screening for malignant neoplasm of cervix: Secondary | ICD-10-CM | POA: Diagnosis present

## 2021-02-11 DIAGNOSIS — Z3A11 11 weeks gestation of pregnancy: Secondary | ICD-10-CM | POA: Insufficient documentation

## 2021-02-11 DIAGNOSIS — Z113 Encounter for screening for infections with a predominantly sexual mode of transmission: Secondary | ICD-10-CM | POA: Diagnosis present

## 2021-02-11 DIAGNOSIS — Z3480 Encounter for supervision of other normal pregnancy, unspecified trimester: Secondary | ICD-10-CM

## 2021-02-11 DIAGNOSIS — Z8759 Personal history of other complications of pregnancy, childbirth and the puerperium: Secondary | ICD-10-CM

## 2021-02-11 NOTE — Patient Instructions (Signed)

## 2021-02-11 NOTE — Progress Notes (Signed)
Subjective:   Gabriella Anderson is a 35 y.o. G3P1011 at 60w4dby LMP being seen today for her first obstetrical visit.  Her obstetrical history is significant for smoker and has Tobacco abuse; History of gestational hypertension; Health care maintenance; and Supervision of other normal pregnancy, antepartum on their problem list.. Patient does intend to breast feed. Pregnancy history fully reviewed.  Patient reports no complaints.  HISTORY: OB History  Gravida Para Term Preterm AB Living  _0 0 1 1  SAB IAB Ectopic Multiple Live Births  0 0 0 0 1    # Outcome Date GA Lbr Len/2nd Weight Sex Delivery Anes PTL Lv  3 Current           2 AB 2017          1 Term 10/18/14 313w5d4:14 / 00:26 6 lb 8.2 oz (2.954 kg) F Vag-Spont EPI  LIV     Birth Comments: none     Name: Broce,GIRL Nichoel     Apgar1: 8  Apgar5: 9   Past Medical History:  Diagnosis Date   Anemia    Infection    UTI   Trichimoniasis 04/11/14   Past Surgical History:  Procedure Laterality Date   NO PAST SURGERIES     Family History  Problem Relation Age of Onset   Hypertension Mother    Cancer Mother        Leukemia   Diabetes Maternal Grandfather    Hypertension Maternal Grandfather    Hearing loss Neg Hx    Social History   Tobacco Use   Smoking status: Every Day    Packs/day: 0.25    Years: 10.00    Pack years: 2.50    Types: Cigarettes    Passive exposure: Never   Smokeless tobacco: Never  Vaping Use   Vaping Use: Never used  Substance Use Topics   Alcohol use: No    Alcohol/week: 0.0 standard drinks    Comment: ETOH use during early pregnancy   Drug use: Yes    Frequency: 7.0 times per week    Types: Marijuana    Comment: last use 01/25/21   No Known Allergies Current Outpatient Medications on File Prior to Visit  Medication Sig Dispense Refill   Blood Pressure Monitoring (BLOOD PRESSURE MONITOR AUTOMAT) DEVI 1 Device by Does not apply route daily. Automatic blood pressure cuff  regular size. To monitor blood pressure regularly at home. ICD-10 code:Z34.90 1 each 0   Misc. Devices (GOJJI WEIGHT SCALE) MISC 1 Device by Does not apply route daily as needed. To weight self daily as needed at home. ICD-10 code: Z34.90 1 each 0   No current facility-administered medications on file prior to visit.   Indications for ASA therapy (per uptodate) One of the following: Previous pregnancy with preeclampsia, especially early onset and with an adverse outcome Yes Multifetal gestation No Chronic hypertension No Type 1 or 2 diabetes mellitus No Chronic kidney disease No Autoimmune disease (antiphospholipid syndrome, systemic lupus erythematosus) No  Two or more of the following: Nulliparity No Obesity (body mass index >30 kg/m2) No Family history of preeclampsia in mother or sister No Age ?35 years No Sociodemographic characteristics (African American race, low socioeconomic level) Yes Personal risk factors (eg, previous pregnancy with low birth weight or small for gestational age infant, previous adverse pregnancy outcome [eg, stillbirth], interval >10 years between pregnancies) No  Indications for early 1 hour GTT (per uptodate)  BMI >25 (>23  in Asian women) AND one of the following  Gestational diabetes mellitus in a previous pregnancy No Glycated hemoglobin ?5.7 percent (39 mmol/mol), impaired glucose tolerance, or impaired fasting glucose on previous testing No First-degree relative with diabetes No High-risk race/ethnicity (eg, African American, Latino, Native American, [place] American, Pacific Islander) Yes History of cardiovascular disease No Hypertension or on therapy for hypertension No High-density lipoprotein cholesterol level <35 mg/dL (0.90 mmol/L) and/or a triglyceride level >250 mg/dL (2.82 mmol/L) No Polycystic ovary syndrome No Physical inactivity No Other clinical condition associated with insulin resistance (eg, severe obesity, acanthosis nigricans)  No Previous birth of an infant weighing ?4000 g No Previous stillbirth of unknown cause No Exam   Vitals:   02/11/21 0832  BP: 119/65  Pulse: 76  Temp: 97.7 F (36.5 C)  Weight: 198 lb 6.4 oz (90 kg)   Fetal Heart Rate (bpm): 151  Uterus:     Pelvic Exam: Perineum: no hemorrhoids, normal perineum   Vulva: normal external genitalia, no lesions   Vagina:  normal mucosa, normal discharge   Cervix: no lesions and normal, pap smear done.    Adnexa: normal adnexa and no mass, fullness, tenderness   Bony Pelvis: average  System: General: well-developed, well-nourished female in no acute distress   Breast:  normal appearance, no masses or tenderness   Skin: normal coloration and turgor, no rashes   Neurologic: oriented, normal, negative, normal mood   Extremities: normal strength, tone, and muscle mass, ROM of all joints is normal   HEENT PERRLA, extraocular movement intact and sclera clear, anicteric   Mouth/Teeth mucous membranes moist, pharynx normal without lesions and dental hygiene good   Neck supple and no masses   Cardiovascular: regular rate and rhythm   Respiratory:  no respiratory distress, normal breath sounds   Abdomen: soft, non-tender; bowel sounds normal; no masses,  no organomegaly     Assessment:   Pregnancy: [id number] Patient Active Problem List   Diagnosis Date Noted   Supervision of other normal pregnancy, antepartum 01/27/2021   Tobacco abuse 07/19/2015   History of gestational hypertension 07/19/2015   Health care maintenance 07/19/2015     Plan:  1. Supervision of other normal pregnancy, antepartum - Routine NOB. Doing well. No concerns. - This is an unplanned, but desired pregnancy - Desires BTL, sign papers at later appointment - Safe meds list provided - Anticipatory guidance provided for upcoming appointments  - Obstetric Panel, Including HIV - Hepatitis C antibody - Culture, OB Urine - Hemoglobin A1c - Cytology - PAP( [place]) -  Genetic Screening - Protein / creatinine ratio, urine - Comp Met (CMET)  2. [redacted] weeks gestation of pregnancy  - Obstetric Panel, Including HIV - Hepatitis C antibody - Culture, OB Urine - Hemoglobin A1c - Cytology - PAP( [place]) - Genetic Screening - Protein / creatinine ratio, urine - Comp Met (CMET)  3. History of pregnancy induced hypertension - Hx gHTN, baseline labs today - BP normotensive today - Plan bASA between 12-16 weeks  - Protein / creatinine ratio, urine - Comp Met (CMET)  4. Tobacco abuse - Smoker x15 years, currently weaning self down. Now smokes 1-2 cigarettes/day - Congratulated patient on weaning and encouraged to continue progress. - Reviewed risks of smoking in pregnancy   Initial labs drawn. Continue prenatal vitamins. Discussed and offered genetic screening options, including Quad screen/AFP, NIPS testing, and option to decline testing. Benefits/risks/alternatives reviewed. Pt aware that anatomy US is form of genetic screening with lower accuracy  in detecting trisomies than blood work.  Pt chooses/declines genetic screening today. NIPS: ordered. Ultrasound discussed; fetal anatomic survey: requested. Problem list reviewed and updated. The nature of [facility] with multiple MDs and other Advanced Practice Providers was explained to patient; also emphasized that residents, students are part of our team. Routine obstetric precautions reviewed. Return in about 4 weeks (around 03/11/2021).    Renee Harder, CNM 02/11/21 12:44 PM

## 2021-02-12 LAB — COMPREHENSIVE METABOLIC PANEL
ALT: 16 IU/L (ref 0–32)
AST: 19 IU/L (ref 0–40)
Albumin/Globulin Ratio: 1.9 (ref 1.2–2.2)
Albumin: 4.6 g/dL (ref 3.8–4.8)
Alkaline Phosphatase: 66 IU/L (ref 44–121)
BUN/Creatinine Ratio: 12 (ref 9–23)
BUN: 8 mg/dL (ref 6–20)
Bilirubin Total: 0.3 mg/dL (ref 0.0–1.2)
CO2: 20 mmol/L (ref 20–29)
Calcium: 10 mg/dL (ref 8.7–10.2)
Chloride: 102 mmol/L (ref 96–106)
Creatinine, Ser: 0.69 mg/dL (ref 0.57–1.00)
Globulin, Total: 2.4 g/dL (ref 1.5–4.5)
Glucose: 83 mg/dL (ref 70–99)
Potassium: 4.6 mmol/L (ref 3.5–5.2)
Sodium: 138 mmol/L (ref 134–144)
Total Protein: 7 g/dL (ref 6.0–8.5)
eGFR: 117 mL/min/{1.73_m2} (ref >59)

## 2021-02-12 LAB — OBSTETRIC PANEL, INCLUDING HIV
Antibody Screen: NEGATIVE
Basophils Absolute: 0.1 10*3/uL (ref 0.0–0.2)
Basos: 1 %
EOS (ABSOLUTE): 0.2 10*3/uL (ref 0.0–0.4)
Eos: 2 %
HIV Screen 4th Generation wRfx: NONREACTIVE
Hematocrit: 34.9 % (ref 34.0–46.6)
Hemoglobin: 12 g/dL (ref 11.1–15.9)
Hepatitis B Surface Ag: NEGATIVE
Immature Grans (Abs): 0 10*3/uL (ref 0.0–0.1)
Immature Granulocytes: 0 %
Lymphocytes Absolute: 2.4 10*3/uL (ref 0.7–3.1)
Lymphs: 23 %
MCH: 31.2 pg (ref 26.6–33.0)
MCHC: 34.4 g/dL (ref 31.5–35.7)
MCV: 91 fL (ref 79–97)
Monocytes Absolute: 0.8 10*3/uL (ref 0.1–0.9)
Monocytes: 7 %
Neutrophils Absolute: 6.9 10*3/uL (ref 1.4–7.0)
Neutrophils: 67 %
Platelets: 302 10*3/uL (ref 150–450)
RBC: 3.85 x10E6/uL (ref 3.77–5.28)
RDW: 12.4 % (ref 11.7–15.4)
RPR Ser Ql: NONREACTIVE
Rh Factor: POSITIVE
Rubella Antibodies, IGG: 11.3 index (ref >0.99)
WBC: 10.4 10*3/uL (ref 3.4–10.8)

## 2021-02-12 LAB — HEPATITIS C ANTIBODY: Hep C Virus Ab: 0.1 s/co ratio (ref 0.0–0.9)

## 2021-02-12 LAB — PROTEIN / CREATININE RATIO, URINE
Creatinine, Urine: 175.2 mg/dL
Protein, Ur: 12.8 mg/dL
Protein/Creat Ratio: 73 mg/g creat (ref 0–200)

## 2021-02-12 LAB — HEMOGLOBIN A1C
Est. average glucose Bld gHb Est-mCnc: 100 mg/dL
Hgb A1c MFr Bld: 5.1 % (ref 4.8–5.6)

## 2021-02-14 LAB — CULTURE, OB URINE

## 2021-02-14 LAB — URINE CULTURE, OB REFLEX: Organism ID, Bacteria: NO GROWTH

## 2021-02-17 LAB — CYTOLOGY - PAP
Chlamydia: NEGATIVE
Comment: NEGATIVE
Comment: NEGATIVE
Comment: NEGATIVE
Comment: NEGATIVE
Comment: NORMAL
HPV 16: NEGATIVE
HPV 18 / 45: NEGATIVE
High risk HPV: POSITIVE — AB
Neisseria Gonorrhea: NEGATIVE
Trichomonas: NEGATIVE

## 2021-02-18 ENCOUNTER — Other Ambulatory Visit: Payer: Self-pay

## 2021-02-23 ENCOUNTER — Inpatient Hospital Stay (HOSPITAL_COMMUNITY)
Admission: AD | Admit: 2021-02-23 | Discharge: 2021-02-23 | Disposition: A | Discharge status: Home / Self Care | Payer: Medicaid Other | Attending: Obstetrics and Gynecology | Admitting: Obstetrics and Gynecology

## 2021-02-23 ENCOUNTER — Other Ambulatory Visit: Payer: Self-pay

## 2021-02-23 DIAGNOSIS — O26891 Other specified pregnancy related conditions, first trimester: Secondary | ICD-10-CM | POA: Diagnosis present

## 2021-02-23 DIAGNOSIS — O4692 Antepartum hemorrhage, unspecified, second trimester: Secondary | ICD-10-CM

## 2021-02-23 DIAGNOSIS — R109 Unspecified abdominal pain: Secondary | ICD-10-CM | POA: Insufficient documentation

## 2021-02-23 DIAGNOSIS — Z3A13 13 weeks gestation of pregnancy: Secondary | ICD-10-CM | POA: Insufficient documentation

## 2021-02-23 DIAGNOSIS — O209 Hemorrhage in early pregnancy, unspecified: Secondary | ICD-10-CM | POA: Diagnosis not present

## 2021-02-23 NOTE — MAU Note (Signed)
Pt presents to MAU with c/o vaginal bleeding and cramping that started around 2330 on 02/22/21.  Pt states she was laying in bed eating and started to feel cramping, pressure, and then a gush of pink fluid. Once she stood, she began to have bright red bleeding and some clots. Currently in MAU, only a streak of pink discharge on her peripad. Pt states that she did have sex on 02/22/21. FHT = 148.

## 2021-02-23 NOTE — Discharge Instructions (Signed)
Return for worsening bleeding or pain.

## 2021-02-23 NOTE — MAU Provider Note (Signed)
History    [id number]  Arrival date and time: 02/23/21 0028  Chief Complaint  Patient presents with   Vaginal Bleeding   Abdominal Cramping   HPI Gabriella Anderson is a 35 y.o. at [redacted]w[redacted]d by who presents to MAU with vaginal bleeding and cramping. Patient states that she passed a large clot earlier today after having sex in the morning on 12/25. She has continued to have spotting but she has not had any more clots. It is bright red in color. She has had some cramping associated with this which is mild. She denies any fevers, nausea, or vomiting. She denies dysuria or abnormal vaginal discharge/irritation. Patient has come to the MAU for further evaluation.   B/Positive/-- (12/14 0920)  OB History     Gravida  3   Para  1   Term  1   Preterm  0   AB  1   Living  1      SAB  0   IAB  0   Ectopic  0   Multiple  0   Live Births  1           Past Medical History:  Diagnosis Date   Anemia    Infection    UTI   Trichimoniasis 04/11/14    Past Surgical History:  Procedure Laterality Date   NO PAST SURGERIES      Family History  Problem Relation Age of Onset   Hypertension Mother    Cancer Mother        Leukemia   Diabetes Maternal Grandfather    Hypertension Maternal Grandfather    Hearing loss Neg Hx     Social History   Socioeconomic History   Marital status: Single    Spouse name: Not on file   Number of children: 1   Years of education: Not on file   Highest education level: Some college, no degree  Occupational History   Not on file  Tobacco Use   Smoking status: Every Day    Packs/day: 0.25    Years: 10.00    Pack years: 2.50    Types: Cigarettes    Passive exposure: Never   Smokeless tobacco: Never  Vaping Use   Vaping Use: Never used  Substance and Sexual Activity   Alcohol use: No    Alcohol/week: 0.0 standard drinks    Comment: ETOH use during early pregnancy   Drug use: Yes    Frequency: 7.0 times per week    Types:  Marijuana    Comment: last use 01/25/21   Sexual activity: Yes    Birth control/protection: None  Other Topics Concern   Not on file  Social History Narrative   Not on file   Social Determinants of Health   Financial Resource Strain: Not on file  Food Insecurity: Not on file  Transportation Needs: Not on file  Physical Activity: Not on file  Stress: Not on file  Social Connections: Not on file  Intimate Partner Violence: Not on file    No Known Allergies  No current facility-administered medications on file prior to encounter.   Current Outpatient Medications on File Prior to Encounter  Medication Sig Dispense Refill   Blood Pressure Monitoring (BLOOD PRESSURE MONITOR AUTOMAT) DEVI 1 Device by Does not apply route daily. Automatic blood pressure cuff regular size. To monitor blood pressure regularly at home. ICD-10 code:Z34.90 1 each 0   Misc. Devices (GOJJI WEIGHT SCALE) MISC 1 Device by Does not apply  route daily as needed. To weight self daily as needed at home. ICD-10 code: Z34.90 1 each 0   ROS Pertinent positives and negative per HPI, all others reviewed and negative  Physical Exam   BP 121/63    Pulse 66    Temp 99.5 F (37.5 C) (Oral)    Resp 18    LMP 11/22/2020 (Approximate)    SpO2 99%   Physical Exam Constitutional:      General: She is not in acute distress.    Appearance: Normal appearance.  HENT:     Head: Normocephalic and atraumatic.     Mouth/Throat:     Mouth: Mucous membranes are moist.  Eyes:     Extraocular Movements: Extraocular movements intact.     Conjunctiva/sclera: Conjunctivae normal.  Cardiovascular:     Rate and Rhythm: Normal rate.  Pulmonary:     Effort: Pulmonary effort is normal.  Abdominal:     Palpations: Abdomen is soft.     Tenderness: There is no abdominal tenderness. There is no guarding or rebound.  Genitourinary:    General: Normal vulva.  Musculoskeletal:        General: Normal range of motion.     Right lower leg:  No edema.     Left lower leg: No edema.  Skin:    General: Skin is warm and dry.  Neurological:     General: No focal deficit present.     Mental Status: She is alert and oriented to person, place, and time.  Psychiatric:        Mood and Affect: Mood normal.        Behavior: Behavior normal.   FHT 148 bpm  Labs No results found for this or any previous visit (from the past 24 hour(s)).  Imaging No results found.  MAU Course  Procedures Lab Orders  No laboratory test(s) ordered today   No orders of the defined types were placed in this encounter.  Imaging Orders  No imaging studies ordered today   MDM Patient presents to MAU after having post-coital bleeding Initially passed a clot, now just having light spotting with some cramping  Recent US on 12/05 confirmed IUP Normal FHT upon arrival Bleeding assessed at bedside and minimal  Discussed bleeding could be related to intercourse or alternative etiology such as subchorionic hemorrhage  Given normal vital signs and minimal bleeding, additional lab work not indicated at this time  Recommended discharge with further outpatient monitoring  Discussed case with Dr. Damita Dunnings who agreed with plan of care   Assessment and Plan   1. Vaginal bleeding in pregnancy, second trimester - Stable for discharge home - Bleeding return precautions reviewed with patient who voiced understanding  - Provided reassurance - Recommended that she continue to monitor her bleeding and return if it worsens significantly or if her pain worsens - Will have patient follow up in office in one week, message sent to scheduling team - All questions and concerns addressed   Genia Del, MD

## 2021-02-25 ENCOUNTER — Other Ambulatory Visit: Payer: Self-pay

## 2021-03-01 NOTE — L&D Delivery Note (Signed)
Delivery Note Gabriella Anderson is a 36 y.o. G3P1011 at 52w0dadmitted for IOL d/t gHTN.   GBS Status: Negative/-- (05/25 0951) Maximum Maternal Temperature: 98.4  Labor course: Initial SVE: 2/50/Ballotable. Augmentation with: AROM and Pitocin. She then progressed to complete.  ROM: 2h 563mith clear fluid  Birth: At 2218 a viable female was delivered via spontaneous vaginal delivery (Presentation:cephalic;LOA). Nuchal cord present: No. Shoulders and body delivered in usual fashion. Infant placed directly on mom's abdomen for bonding/skin-to-skin, baby dried and stimulated. Cord clamped x 2 after 1 minute and cut by patient's sister. Cord blood collected.  The placenta separated spontaneously and delivered via gentle cord traction.  Pitocin infused rapidly IV per protocol. Fundus firm with massage.  Placenta inspected and appears to be intact with a 3 VC.  Placenta/Cord with the following complications: none.  Cord pH: n/a Sponge and instrument count were correct x2.  Intrapartum complications:  None Anesthesia:  none Episiotomy: none Lacerations: left labial, hemostatic and not repaired. Right labial abrasion, hemostatic  Suture Repair: n/a EBL (mL): 50   Infant: APGAR (1 MIN): 8   APGAR (5 MINS): 9   Infant weight: pending  Mom to postpartum.  Baby to Couplet care / Skin to Skin. Placenta to L&D   Plans to Bottlefeed Contraception: IUD outpatient Circumcision: wants inpatient  [facility].   DaAtalissa/05/2021 10:39 PM

## 2021-03-04 ENCOUNTER — Ambulatory Visit (INDEPENDENT_AMBULATORY_CARE_PROVIDER_SITE_OTHER): Payer: Medicaid Other

## 2021-03-04 ENCOUNTER — Other Ambulatory Visit: Payer: Self-pay

## 2021-03-04 VITALS — BP 128/62 | HR 74 | Temp 97.9°F | Wt 202.0 lb

## 2021-03-04 DIAGNOSIS — Z8759 Personal history of other complications of pregnancy, childbirth and the puerperium: Secondary | ICD-10-CM

## 2021-03-04 DIAGNOSIS — Z3480 Encounter for supervision of other normal pregnancy, unspecified trimester: Secondary | ICD-10-CM

## 2021-03-04 DIAGNOSIS — R87612 Low grade squamous intraepithelial lesion on cytologic smear of cervix (LGSIL): Secondary | ICD-10-CM

## 2021-03-04 DIAGNOSIS — Z3A14 14 weeks gestation of pregnancy: Secondary | ICD-10-CM

## 2021-03-04 NOTE — Progress Notes (Signed)
° °  PRENATAL VISIT NOTE  Subjective:  Gabriella Anderson is a 36 y.o. G3P1011 at [redacted]w[redacted]d being seen today for ongoing prenatal care.  She is currently monitored for the following issues for this low-risk pregnancy and has Tobacco abuse; History of gestational hypertension; Health care maintenance; and Supervision of other normal pregnancy, antepartum on their problem list.  Patient reports was seen in MAU on 12/26 for vaginal bleeding. Bleeding was bright red and continued for about 1 week. Bleeding has improved and is now brown spotting. Requests speculum exam as she did not have one in MAU. Contractions: Not present. Vag. Bleeding: Other.  Movement: Present. Denies leaking of fluid.   The following portions of the patient's history were reviewed and updated as appropriate: allergies, current medications, past family history, past medical history, past social history, past surgical history and problem list.   Objective:   Vitals:   03/04/21 1517  BP: 128/62  Pulse: 74  Temp: 97.9 F (36.6 C)  Weight: 202 lb (91.6 kg)    Fetal Status: Fetal Heart Rate (bpm): 150   Movement: Present     General:  Alert, oriented and cooperative. Patient is in no acute distress.  Skin: Skin is warm and dry. No rash noted.   Cardiovascular: Normal heart rate noted  Respiratory: Normal respiratory effort, no problems with respiration noted  Abdomen: Soft, gravid, appropriate for gestational age.  Pain/Pressure: Present     Pelvic: NEFG, vaginal walls pink with rugae, no bleeding, scant physiologic discharge, cervix appears visually closed without lesions/masses         Extremities: Normal range of motion.  Edema: None  Mental Status: Normal mood and affect. Normal behavior. Normal judgment and thought content.   Assessment and Plan:  Pregnancy: G3P1011 at [redacted]w[redacted]d 1. Supervision of other normal pregnancy, antepartum - ROB. Doing well - Reassurance provided on vaginal bleeding, none seen today. FHT's  present. - Anticipatory guidance for upcoming appointments provided  - [place] MFM OB COMP + 14 WK; Future  2. [redacted] weeks gestation of pregnancy   3. History of gestational hypertension - Start bASA today  4. LGSIL on Pap smear of cervix - Needs colpo   Preterm labor symptoms and general obstetric precautions including but not limited to vaginal bleeding, contractions, leaking of fluid and fetal movement were reviewed in detail with the patient. Please refer to After Visit Summary for other counseling recommendations.   Return in about 5 weeks (around 04/08/2021).  Future Appointments  Date Time Provider [place]  03/30/2021  9:00 AM [facility] US1 [facility]  04/08/2021  2:15 PM Gavin Pound, CNM [facility] None  04/15/2021  9:55 AM Gabriel Carina, CNM [place] [place] None    Renee Harder, CNM 03/04/21 4:43 PM

## 2021-03-11 ENCOUNTER — Encounter: Payer: Medicaid Other | Admitting: Certified Nurse Midwife

## 2021-03-30 ENCOUNTER — Other Ambulatory Visit: Payer: Self-pay | Admitting: *Deleted

## 2021-03-30 ENCOUNTER — Ambulatory Visit: Payer: Medicaid Other

## 2021-03-30 ENCOUNTER — Other Ambulatory Visit: Payer: Self-pay

## 2021-03-30 ENCOUNTER — Ambulatory Visit: Payer: Medicaid Other | Attending: Obstetrics | Admitting: Obstetrics

## 2021-03-30 DIAGNOSIS — Z3A19 19 weeks gestation of pregnancy: Secondary | ICD-10-CM | POA: Diagnosis not present

## 2021-03-30 DIAGNOSIS — Z3480 Encounter for supervision of other normal pregnancy, unspecified trimester: Secondary | ICD-10-CM

## 2021-03-30 DIAGNOSIS — O35BXX0 Maternal care for other (suspected) fetal abnormality and damage, fetal cardiac anomalies, not applicable or unspecified: Secondary | ICD-10-CM

## 2021-03-30 DIAGNOSIS — Z8759 Personal history of other complications of pregnancy, childbirth and the puerperium: Secondary | ICD-10-CM

## 2021-03-30 DIAGNOSIS — Z363 Encounter for antenatal screening for malformations: Secondary | ICD-10-CM | POA: Diagnosis present

## 2021-03-30 DIAGNOSIS — O09292 Supervision of pregnancy with other poor reproductive or obstetric history, second trimester: Secondary | ICD-10-CM | POA: Insufficient documentation

## 2021-03-30 NOTE — Progress Notes (Signed)
MFM Note  Gabriella Anderson was seen for a detailed fetal anatomy scan.   She denies any significant past medical history and denies any problems in her current pregnancy.    She had a cell free DNA test earlier in her pregnancy which indicated a low risk for trisomy 9, 36, and 13. A female fetus is predicted.   Based on the fetal biometry measurements obtained today, her EDC was changed to August 23, 2021, making her 19 weeks and 1 day pregnant today.  The [facility] of August 23, 2021 would be consistent with her first trimester ultrasound that was performed by radiology.  On today's exam, an intracardiac echogenic focus was noted in the left ventricle of the fetal heart.  The small association between an echogenic focus and Down syndrome was discussed. Due to the echogenic focus noted today, the patient was offered and declined an amniocentesis today for definitive diagnosis of fetal aneuploidy.  She reports that she is comfortable with her negative cell free DNA test.  The views of the fetal anatomy were limited today due to the fetal position.  The patient was informed that anomalies may be missed due to technical limitations. If the fetus is in a suboptimal position or maternal habitus is increased, visualization of the fetus in the maternal uterus may be impaired.  A follow-up exam was scheduled in 4 weeks to complete the views of the fetal anatomy and to confirm her dates.   The patient stated that all of her questions have been answered today.  A total of 30 minutes was spent counseling and coordinating the care for this patient.  Greater than 50% of the time was spent in direct face-to-face contact.

## 2021-04-08 ENCOUNTER — Other Ambulatory Visit: Payer: Self-pay

## 2021-04-08 ENCOUNTER — Ambulatory Visit (INDEPENDENT_AMBULATORY_CARE_PROVIDER_SITE_OTHER): Payer: Medicaid Other

## 2021-04-08 VITALS — BP 123/72 | HR 90 | Temp 98.4°F | Wt 210.8 lb

## 2021-04-08 DIAGNOSIS — Z3A20 20 weeks gestation of pregnancy: Secondary | ICD-10-CM

## 2021-04-08 DIAGNOSIS — Z3480 Encounter for supervision of other normal pregnancy, unspecified trimester: Secondary | ICD-10-CM

## 2021-04-08 NOTE — Progress Notes (Signed)
° °  LOW-RISK PREGNANCY OFFICE VISIT  Patient name: Gabriella Anderson MRN [id number]  Date of birth: 01-12-1986 Chief Complaint:   Routine Prenatal Visit  Subjective:   Gabriella Anderson is a 36 y.o. G1P1011 female at [redacted]w[redacted]d with an Estimated Date of Delivery: 08/23/21 being seen today for ongoing management of a low-risk pregnancy aeb has Tobacco abuse; History of gestational hypertension; Health care maintenance; and Supervision of other normal pregnancy, antepartum on their problem list.  Patient presents today with no complaints.  Patient endorses fetal movement. Patient denies abdominal cramping or contractions.  Patient denies vaginal concerns including abnormal discharge, leaking of fluid, and bleeding.  Contractions: Not present. Vag. Bleeding: None.  Movement: Present.  Reviewed past medical,surgical, social, obstetrical and family history as well as problem list, medications and allergies.  Objective   Vitals:   04/08/21 1413  BP: 123/72  Pulse: 90  Temp: 98.4 F (36.9 C)  Weight: 210 lb 12.8 oz (95.6 kg)  Body mass index is 28.59 kg/m.  Total Weight Gain:15 lb 12.8 oz (7.167 kg)         Physical Examination:   General appearance: Well appearing, and in no distress  Mental status: Alert, oriented to person, place, and time  Skin: Warm & dry  Cardiovascular: Normal heart rate noted  Respiratory: Normal respiratory effort, no distress  Abdomen: Soft, gravid, nontender, AGA with fundus U/-1  Pelvic: Cervical exam deferred           Extremities: Edema: None  Fetal Status: Fetal Heart Rate (bpm): 150  Movement: Present   No results found for this or any previous visit (from the past 24 hour(s)).  Assessment & Plan:  Low-risk pregnancy of a 36 y.o., G3P1011 at [redacted]w[redacted]d with an Estimated Date of Delivery: 08/23/21   1. Supervision of other normal pregnancy, antepartum -Anticipatory guidance for upcoming appts. -Patient to schedule next appt in 4 weeks for an in-person  visit. -Will be transferred to [facility] for remainder of care.    2. [redacted] weeks gestation of pregnancy -Doing well -AFP today. -[place] reviewed and EICF noted. Follow up scheduled.       Meds: No orders of the defined types were placed in this encounter.  Labs/procedures today:  Lab Orders         AFP, Serum, Open Spina Bifida      Reviewed: Preterm labor symptoms and general obstetric precautions including but not limited to vaginal bleeding, contractions, leaking of fluid and fetal movement were reviewed in detail with the patient.  All questions were answered.  Follow-up: No follow-ups on file.  Orders Placed This Encounter  Procedures   AFP, Serum, Open Spina [place] Bashar Milam MSN, CNM 04/08/2021

## 2021-04-10 LAB — AFP, SERUM, OPEN SPINA BIFIDA
AFP MoM: 0.89
AFP Value: 49.1 ng/mL
Gest. Age on Collection Date: 20.3 weeks
Maternal Age At EDD: 34 yr
OSBR Risk 1 IN: 10000
Test Results:: NEGATIVE
Weight: 210 [lb_av]

## 2021-04-15 ENCOUNTER — Encounter: Payer: Medicaid Other | Admitting: Certified Nurse Midwife

## 2021-04-27 ENCOUNTER — Other Ambulatory Visit: Payer: Self-pay | Admitting: *Deleted

## 2021-04-27 ENCOUNTER — Ambulatory Visit: Payer: Medicaid Other | Admitting: *Deleted

## 2021-04-27 ENCOUNTER — Ambulatory Visit: Payer: Medicaid Other | Attending: Obstetrics and Gynecology

## 2021-04-27 ENCOUNTER — Encounter: Payer: Self-pay | Admitting: *Deleted

## 2021-04-27 ENCOUNTER — Other Ambulatory Visit: Payer: Self-pay

## 2021-04-27 VITALS — BP 134/64 | HR 63

## 2021-04-27 DIAGNOSIS — O3412 Maternal care for benign tumor of corpus uteri, second trimester: Secondary | ICD-10-CM

## 2021-04-27 DIAGNOSIS — Z8759 Personal history of other complications of pregnancy, childbirth and the puerperium: Secondary | ICD-10-CM | POA: Insufficient documentation

## 2021-04-27 DIAGNOSIS — O3413 Maternal care for benign tumor of corpus uteri, third trimester: Secondary | ICD-10-CM

## 2021-04-27 DIAGNOSIS — D259 Leiomyoma of uterus, unspecified: Secondary | ICD-10-CM

## 2021-04-27 DIAGNOSIS — O09292 Supervision of pregnancy with other poor reproductive or obstetric history, second trimester: Secondary | ICD-10-CM | POA: Diagnosis not present

## 2021-04-27 DIAGNOSIS — Z3A23 23 weeks gestation of pregnancy: Secondary | ICD-10-CM

## 2021-04-27 DIAGNOSIS — Z362 Encounter for other antenatal screening follow-up: Secondary | ICD-10-CM | POA: Insufficient documentation

## 2021-05-06 ENCOUNTER — Ambulatory Visit (INDEPENDENT_AMBULATORY_CARE_PROVIDER_SITE_OTHER): Payer: Medicaid Other

## 2021-05-06 ENCOUNTER — Other Ambulatory Visit: Payer: Self-pay

## 2021-05-06 VITALS — BP 132/73 | HR 77 | Wt 214.3 lb

## 2021-05-06 DIAGNOSIS — R87612 Low grade squamous intraepithelial lesion on cytologic smear of cervix (LGSIL): Secondary | ICD-10-CM

## 2021-05-06 DIAGNOSIS — Z3480 Encounter for supervision of other normal pregnancy, unspecified trimester: Secondary | ICD-10-CM

## 2021-05-06 DIAGNOSIS — Z3A24 24 weeks gestation of pregnancy: Secondary | ICD-10-CM

## 2021-05-06 NOTE — Progress Notes (Signed)
Patient presents as tx from [place] for [place]. ?

## 2021-05-06 NOTE — Progress Notes (Signed)
? ?  LOW-RISK PREGNANCY OFFICE VISIT ? ?Patient name: Gabriella Anderson MRN [id number]  Date of birth: [birth year] ?Chief Complaint:   ?Routine Prenatal Visit ? ?Subjective:   ?Gabriella Anderson is a 36 y.o. G65P1011 female at 68w3dwith an Estimated Date of Delivery: 08/23/21 being seen today for ongoing management of a low-risk pregnancy aeb has Tobacco abuse; History of gestational hypertension; Health care maintenance; and Supervision of other normal pregnancy, antepartum on their problem list. ? ?Patient presents today, alone, with no complaints.  Patient endorses fetal movement. Patient denies abdominal cramping or contractions.  Patient denies vaginal concerns including abnormal discharge, leaking of fluid, and bleeding. No issues with urination, constipation, or diarrhea.  ? Contractions: Not present. Vag. Bleeding: None.  Movement: Present. ? ?Reviewed past medical,surgical, social, obstetrical and family history as well as problem list, medications and allergies. ? ?Objective  ? ?Vitals:  ? 05/06/21 0848  ?BP: 132/73  ?Pulse: 77  ?Weight: 214 lb 4.8 oz (97.2 kg)  ?Body mass index is 29.06 kg/m?.  ?Total Weight Gain:19 lb 4.8 oz (8.754 kg) ? ?  ?     Physical Examination:  ? General appearance: Well appearing, and in no distress ? Mental status: Alert, oriented to person, place, and time ? Skin: Warm & dry ? Cardiovascular: Normal heart rate noted ? Respiratory: Normal respiratory effort, no distress ? Abdomen: Soft, gravid, nontender, AGA with Fundal Height: 26 cm ? Pelvic: Cervical exam deferred          ? Extremities: Edema: None ? ?Fetal Status: Fetal Heart Rate (bpm): 145  Movement: Present  ? ?No results found for this or any previous visit (from the past 24 hour(s)).  ?Assessment & Plan:  ?Low-risk pregnancy of a 36y.o., G3P1011 at 238w3dith an Estimated Date of Delivery: 08/23/21  ? ?1. Supervision of other normal pregnancy, antepartum ?-Anticipatory guidance for upcoming appts. ?-Patient to schedule  next appt in 3-4 weeks for an in-person visit. ?-Reviewed Glucola appt preparation including fasting the night before and morning of.   ?*Informed that it is okay to drink plain water throughout the night and prior to consumption of Glucola formula.  ?-Discussed anticipated office time of 2.5-3 hours.  ?-Reviewed blood draw procedures and labs which also include check of iron/HgB level, RPR, and HIV ?*Informed that repeat RPR/HIV are for pediatric records/compliance.  ?-Discussed how results of GTT are handled including diabetic education and BS testing for abnormal results and routine care for normal results.  ? ?2. [redacted] weeks gestation of pregnancy ?-Doing well. ?-Desires BTL. ?-Plan for next visit with MD for signing of consents.  ? ?3. LGSIL on Pap smear of cervix ?-Discussed need for colpo. ?-Informed that recommendation is for PP procedure in case samples/biopsy needed. ?-Patient verbalizes understanding and agreeable.  ? ? ?  ?Meds: No orders of the defined types were placed in this encounter. ? ?Labs/procedures today:  ?Lab Orders  ?No laboratory test(s) ordered today  ?  ? ?Reviewed: Preterm labor symptoms and general obstetric precautions including but not limited to vaginal bleeding, contractions, leaking of fluid and fetal movement were reviewed in detail with the patient.  All questions were answered. ? ?Follow-up: Return in about 4 weeks (around 06/03/2021) for LRM[place]ith GTT. ? ?No orders of the defined types were placed in this encounter. ? ?JeMaryann ConnersSN, CNM ?05/06/2021 ? ?

## 2021-06-03 ENCOUNTER — Ambulatory Visit (INDEPENDENT_AMBULATORY_CARE_PROVIDER_SITE_OTHER): Payer: Medicaid Other | Admitting: Obstetrics and Gynecology

## 2021-06-03 ENCOUNTER — Encounter: Payer: Self-pay | Admitting: Obstetrics and Gynecology

## 2021-06-03 ENCOUNTER — Other Ambulatory Visit: Payer: Medicaid Other

## 2021-06-03 VITALS — BP 134/80 | HR 74 | Wt 220.7 lb

## 2021-06-03 DIAGNOSIS — Z3480 Encounter for supervision of other normal pregnancy, unspecified trimester: Secondary | ICD-10-CM

## 2021-06-03 DIAGNOSIS — Z23 Encounter for immunization: Secondary | ICD-10-CM

## 2021-06-03 DIAGNOSIS — R87612 Low grade squamous intraepithelial lesion on cytologic smear of cervix (LGSIL): Secondary | ICD-10-CM | POA: Insufficient documentation

## 2021-06-03 DIAGNOSIS — Z8759 Personal history of other complications of pregnancy, childbirth and the puerperium: Secondary | ICD-10-CM

## 2021-06-03 NOTE — Progress Notes (Signed)
? ?  PRENATAL VISIT NOTE ? ?Subjective:  ?Gabriella Anderson is a 36 y.o. G3P1011 at 10w3dbeing seen today for ongoing prenatal care.  She is currently monitored for the following issues for this low-risk pregnancy and has Tobacco abuse; History of gestational hypertension; Supervision of other normal pregnancy, antepartum; and LGSIL on Pap smear of cervix on their problem list. ? ?Patient reports no complaints.  Contractions: Not present. Vag. Bleeding: None.  Movement: Present. Denies leaking of fluid.  ? ?The following portions of the patient's history were reviewed and updated as appropriate: allergies, current medications, past family history, past medical history, past social history, past surgical history and problem list.  ? ?Objective:  ? ?Vitals:  ? 06/03/21 0828  ?BP: 134/80  ?Pulse: 74  ?Weight: 220 lb 11.2 oz (100.1 kg)  ? ? ?Fetal Status: Fetal Heart Rate (bpm): 145 Fundal Height: 28 cm Movement: Present    ? ?General:  Alert, oriented and cooperative. Patient is in no acute distress.  ?Skin: Skin is warm and dry. No rash noted.   ?Cardiovascular: Normal heart rate noted  ?Respiratory: Normal respiratory effort, no problems with respiration noted  ?Abdomen: Soft, gravid, appropriate for gestational age.  Pain/Pressure: Present     ?Pelvic: Cervical exam deferred        ?Extremities: Normal range of motion.  Edema: Trace  ?Mental Status: Normal mood and affect. Normal behavior. Normal judgment and thought content.  ? ?Assessment and Plan:  ?Pregnancy: G3P1011 at 282w3d1. Supervision of other normal pregnancy, antepartum ?Patient is doing well without complaints ?Third trimester labs, glucola and tdap today ?Patient desires BTL- consent signed today ? ?2. History of gestational hypertension ?Continue ASA ? ?3. LGSIL on Pap smear of cervix ?Plan for postpartum colposcopy ? ?Preterm labor symptoms and general obstetric precautions including but not limited to vaginal bleeding, contractions, leaking of  fluid and fetal movement were reviewed in detail with the patient. ?Please refer to After Visit Summary for other counseling recommendations.  ? ?Return in about 2 weeks (around 06/17/2021) for in person, ROB, Low risk. ? ?Future Appointments  ?Date Time Provider [place]?06/08/2021 10:15 AM [facility] NURSE [facility]  ?06/08/2021 10:30 AM [facility] US2 [facility]  ? ? ?PeMora BellmanMD ? ?

## 2021-06-03 NOTE — Progress Notes (Signed)
Pt in office for [place] visit and GTT.  ?

## 2021-06-04 LAB — CBC
Hematocrit: 30.3 % — ABNORMAL LOW (ref 34.0–46.6)
Hemoglobin: 10.1 g/dL — ABNORMAL LOW (ref 11.1–15.9)
MCH: 31.6 pg (ref 26.6–33.0)
MCHC: 33.3 g/dL (ref 31.5–35.7)
MCV: 95 fL (ref 79–97)
Platelets: 253 10*3/uL (ref 150–450)
RBC: 3.2 x10E6/uL — ABNORMAL LOW (ref 3.77–5.28)
RDW: 12.6 % (ref 11.7–15.4)
WBC: 12.6 10*3/uL — ABNORMAL HIGH (ref 3.4–10.8)

## 2021-06-04 LAB — GLUCOSE TOLERANCE, 2 HOURS W/ 1HR
Glucose, 1 hour: 100 mg/dL (ref 70–179)
Glucose, 2 hour: 62 mg/dL — ABNORMAL LOW (ref 70–152)
Glucose, Fasting: 80 mg/dL (ref 70–91)

## 2021-06-04 LAB — RPR: RPR Ser Ql: NONREACTIVE

## 2021-06-04 LAB — HIV ANTIBODY (ROUTINE TESTING W REFLEX): HIV Screen 4th Generation wRfx: NONREACTIVE

## 2021-06-08 ENCOUNTER — Other Ambulatory Visit: Payer: Self-pay | Admitting: *Deleted

## 2021-06-08 ENCOUNTER — Encounter: Payer: Self-pay | Admitting: *Deleted

## 2021-06-08 ENCOUNTER — Ambulatory Visit: Payer: Medicaid Other | Admitting: *Deleted

## 2021-06-08 ENCOUNTER — Ambulatory Visit: Payer: Medicaid Other | Attending: Obstetrics

## 2021-06-08 VITALS — BP 137/62 | HR 79

## 2021-06-08 DIAGNOSIS — D259 Leiomyoma of uterus, unspecified: Secondary | ICD-10-CM | POA: Diagnosis not present

## 2021-06-08 DIAGNOSIS — Z3A29 29 weeks gestation of pregnancy: Secondary | ICD-10-CM | POA: Diagnosis not present

## 2021-06-08 DIAGNOSIS — Z3689 Encounter for other specified antenatal screening: Secondary | ICD-10-CM | POA: Diagnosis present

## 2021-06-08 DIAGNOSIS — O09293 Supervision of pregnancy with other poor reproductive or obstetric history, third trimester: Secondary | ICD-10-CM | POA: Diagnosis not present

## 2021-06-08 DIAGNOSIS — O3413 Maternal care for benign tumor of corpus uteri, third trimester: Secondary | ICD-10-CM | POA: Diagnosis present

## 2021-06-08 DIAGNOSIS — O283 Abnormal ultrasonic finding on antenatal screening of mother: Secondary | ICD-10-CM

## 2021-06-08 DIAGNOSIS — Z8759 Personal history of other complications of pregnancy, childbirth and the puerperium: Secondary | ICD-10-CM

## 2021-06-15 ENCOUNTER — Inpatient Hospital Stay (HOSPITAL_COMMUNITY)
Admission: AD | Admit: 2021-06-15 | Discharge: 2021-06-15 | Disposition: A | Discharge status: Home / Self Care | Payer: Medicaid Other | Attending: Family Medicine | Admitting: Family Medicine

## 2021-06-15 ENCOUNTER — Encounter (HOSPITAL_COMMUNITY): Payer: Self-pay | Admitting: Obstetrics and Gynecology

## 2021-06-15 ENCOUNTER — Other Ambulatory Visit: Payer: Self-pay

## 2021-06-15 DIAGNOSIS — Z79899 Other long term (current) drug therapy: Secondary | ICD-10-CM | POA: Diagnosis not present

## 2021-06-15 DIAGNOSIS — O09293 Supervision of pregnancy with other poor reproductive or obstetric history, third trimester: Secondary | ICD-10-CM | POA: Insufficient documentation

## 2021-06-15 DIAGNOSIS — O26893 Other specified pregnancy related conditions, third trimester: Secondary | ICD-10-CM | POA: Diagnosis present

## 2021-06-15 DIAGNOSIS — R519 Headache, unspecified: Secondary | ICD-10-CM | POA: Diagnosis not present

## 2021-06-15 DIAGNOSIS — Z3A30 30 weeks gestation of pregnancy: Secondary | ICD-10-CM | POA: Diagnosis not present

## 2021-06-15 HISTORY — DX: Unspecified abnormal cytological findings in specimens from vagina: R87.629

## 2021-06-15 HISTORY — DX: Benign neoplasm of connective and other soft tissue, unspecified: D21.9

## 2021-06-15 HISTORY — DX: Essential (primary) hypertension: I10

## 2021-06-15 LAB — PROTEIN / CREATININE RATIO, URINE
Creatinine, Urine: 107.22 mg/dL
Protein Creatinine Ratio: 0.08 mg/mg{Cre} (ref 0.00–0.15)
Total Protein, Urine: 9 mg/dL

## 2021-06-15 LAB — URINALYSIS, ROUTINE W REFLEX MICROSCOPIC
Bacteria, UA: NONE SEEN
Bilirubin Urine: NEGATIVE
Glucose, UA: NEGATIVE mg/dL
Hgb urine dipstick: NEGATIVE
Ketones, ur: NEGATIVE mg/dL
Nitrite: NEGATIVE
Protein, ur: NEGATIVE mg/dL
Specific Gravity, Urine: 1.014 (ref 1.005–1.030)
pH: 7 (ref 5.0–8.0)

## 2021-06-15 LAB — CBC
HCT: 31.7 % — ABNORMAL LOW (ref 36.0–46.0)
Hemoglobin: 10.6 g/dL — ABNORMAL LOW (ref 12.0–15.0)
MCH: 32 pg (ref 26.0–34.0)
MCHC: 33.4 g/dL (ref 30.0–36.0)
MCV: 95.8 fL (ref 80.0–100.0)
Platelets: 270 10*3/uL (ref 150–400)
RBC: 3.31 MIL/uL — ABNORMAL LOW (ref 3.87–5.11)
RDW: 13.8 % (ref 11.5–15.5)
WBC: 13.9 10*3/uL — ABNORMAL HIGH (ref 4.0–10.5)
nRBC: 0 % (ref 0.0–0.2)

## 2021-06-15 LAB — COMPREHENSIVE METABOLIC PANEL
ALT: 12 U/L (ref 0–44)
AST: 11 U/L — ABNORMAL LOW (ref 15–41)
Albumin: 2.8 g/dL — ABNORMAL LOW (ref 3.5–5.0)
Alkaline Phosphatase: 100 U/L (ref 38–126)
Anion gap: 6 (ref 5–15)
BUN: 5 mg/dL — ABNORMAL LOW (ref 6–20)
CO2: 24 mmol/L (ref 22–32)
Calcium: 9.1 mg/dL (ref 8.9–10.3)
Chloride: 106 mmol/L (ref 98–111)
Creatinine, Ser: 0.56 mg/dL (ref 0.44–1.00)
GFR, Estimated: >60 mL/min (ref >60)
Glucose, Bld: 92 mg/dL (ref 70–99)
Potassium: 3.7 mmol/L (ref 3.5–5.1)
Sodium: 136 mmol/L (ref 135–145)
Total Bilirubin: 0.7 mg/dL (ref 0.3–1.2)
Total Protein: 6.3 g/dL — ABNORMAL LOW (ref 6.5–8.1)

## 2021-06-15 MED ORDER — CYCLOBENZAPRINE HCL 10 MG PO TABS: 10.0000 mg | ORAL_TABLET | Freq: Three times a day (TID) | ORAL | 2 refills | Status: AC | PRN

## 2021-06-15 MED ORDER — CYCLOBENZAPRINE HCL 5 MG PO TABS
10.0000 mg | ORAL_TABLET | Freq: Once | ORAL | Status: AC
  Administered 2021-06-15: 10 mg via ORAL
  Filled 2021-06-15: qty 2

## 2021-06-15 MED ORDER — DIPHENHYDRAMINE HCL 50 MG/ML IJ SOLN
25.0000 mg | Freq: Once | INTRAMUSCULAR | Status: AC
  Administered 2021-06-15: 25 mg via INTRAVENOUS
  Filled 2021-06-15: qty 1

## 2021-06-15 MED ORDER — METOCLOPRAMIDE HCL 5 MG/ML IJ SOLN
10.0000 mg | Freq: Once | INTRAMUSCULAR | Status: AC
  Administered 2021-06-15: 10 mg via INTRAVENOUS
  Filled 2021-06-15: qty 2

## 2021-06-15 MED ORDER — LACTATED RINGERS IV BOLUS
1000.0000 mL | Freq: Once | INTRAVENOUS | Status: AC
  Administered 2021-06-15: 1000 mL via INTRAVENOUS

## 2021-06-15 NOTE — MAU Provider Note (Addendum)
?History  ?  ? ?CSN: [id number] ? ?Arrival date and time: 06/15/21 3536 ? ? Event Date/Time  ? First Provider Initiated Contact with Patient 06/15/21 0740   ?  ? ?Chief Complaint  ?Patient presents with  ? Headache  ? ?HPI ?Gabriella Anderson is a 36 y.o. G3P1011 at 73w1dwho presents with a headache. She reports she first noticed the headache on Saturday and it has persisted since then. She reports it is throbbing on both sides of her head and she has never had a headache like this before. She rates the pain a 9/10 and tried tylenol and an extra aspirin with no relief. She denies any visual changes or epigastric pain. She reports she is eating normally and trying to drink water. She denies any abdominal pain, vaginal bleeding or discharge. She reports normal fetal movement. She has a history of gestational hypertension in her previous pregnancy and is concerned that this is related to her blood pressure.  ? ?OB History   ? ? Gravida  ?3  ? Para  ?1  ? Term  ?1  ? Preterm  ?0  ? AB  ?1  ? Living  ?1  ?  ? ? SAB  ?0  ? IAB  ?0  ? Ectopic  ?0  ? Multiple  ?0  ? Live Births  ?1  ?   ?  ?  ? ?OB History   ? ? Gravida  ?3  ? Para  ?1  ? Term  ?1  ? Preterm  ?0  ? AB  ?1  ? Living  ?1  ?  ? ? SAB  ?0  ? IAB  ?0  ? Ectopic  ?0  ? Multiple  ?0  ? Live Births  ?1  ?   ?  ?  ? ?Past Medical History:  ?Diagnosis Date  ? Anemia   ? Fibroid   ? Hypertension   ? Infection   ? UTI  ? Trichimoniasis 04/11/2014  ? Vaginal Pap smear, abnormal   ? ? ?Past Surgical History:  ?Procedure Laterality Date  ? NO PAST SURGERIES    ? ? ?Family History  ?Problem Relation Age of Onset  ? Hypertension Mother   ? Cancer Mother   ?     Leukemia  ? Diabetes Maternal Grandfather   ? Hypertension Maternal Grandfather   ? Hearing loss Neg Hx   ? ? ?Social History  ? ?Tobacco Use  ? Smoking status: Some Days  ?  Packs/day: 0.25  ?  Years: 10.00  ?  Pack years: 2.50  ?  Types: Cigarettes  ?  Passive exposure: Never  ? Smokeless tobacco: Never  ?Vaping  Use  ? Vaping Use: Never used  ?Substance Use Topics  ? Alcohol use: Not Currently  ?  Comment: ETOH use during early pregnancy  ? Drug use: Not Currently  ?  Frequency: 7.0 times per week  ?  Types: Marijuana  ?  Comment: last use 01/25/21  ? ? ?Allergies: No Known Allergies ? ?Medications Prior to Admission  ?Medication Sig Dispense Refill Last Dose  ? acetaminophen (TYLENOL) 500 MG tablet Take 500 mg by mouth every 6 (six) hours as needed.   06/14/2021 at 1800  ? ASPIRIN 81 PO Take by mouth.   06/14/2021 at 0100  ? Prenatal Vit-Fe Fumarate-FA (MULTIVITAMIN-PRENATAL) 27-0.8 MG TABS tablet Take 1 tablet by mouth daily at 12 noon.   06/14/2021  ? Blood Pressure Monitoring (BLOOD PRESSURE MONITOR  AUTOMAT) DEVI 1 Device by Does not apply route daily. Automatic blood pressure cuff regular size. To monitor blood pressure regularly at home. ICD-10 code:Z34.90 1 each 0   ? Misc. Devices (GOJJI WEIGHT SCALE) MISC 1 Device by Does not apply route daily as needed. To weight self daily as needed at home. ICD-10 code: Z34.90 1 each 0   ? ? ?Review of Systems  ?Constitutional: Negative.  Negative for fatigue and fever.  ?HENT: Negative.    ?Respiratory: Negative.  Negative for shortness of breath.   ?Cardiovascular: Negative.  Negative for chest pain.  ?Gastrointestinal: Negative.  Negative for abdominal pain, constipation, diarrhea, nausea and vomiting.  ?Genitourinary: Negative.  Negative for dysuria, vaginal bleeding and vaginal discharge.  ?Neurological:  Positive for headaches. Negative for dizziness.  ?Physical Exam  ? ?Blood pressure 132/62, pulse 60, temperature 98.1 ?F (36.7 ?C), temperature source Oral, resp. rate 17, height 6' (1.829 m), weight 99.3 kg, last menstrual period 11/22/2020, SpO2 99 %, unknown if currently breastfeeding. ? ?Patient Vitals for the past 24 hrs: ? BP Temp Temp src Pulse Resp SpO2 Height Weight  ?06/15/21 0930 132/62 -- -- 60 -- -- -- --  ?06/15/21 0815 126/62 -- -- 64 -- 99 % -- --  ?06/15/21  0800 126/64 -- -- 63 -- 99 % -- --  ?06/15/21 0745 135/61 -- -- 64 -- 99 % -- --  ?06/15/21 0736 131/62 -- -- 68 -- -- -- --  ?06/15/21 0655 (!) 143/55 98.1 ?F (36.7 ?C) Oral 63 17 99 % 6' (1.829 m) 99.3 kg  ? ?Physical Exam ?Vitals and nursing note reviewed.  ?Constitutional:   ?   General: She is not in acute distress. ?   Appearance: She is well-developed.  ?HENT:  ?   Head: Normocephalic.  ?Eyes:  ?   Pupils: Pupils are equal, round, and reactive to light.  ?Cardiovascular:  ?   Rate and Rhythm: Normal rate and regular rhythm.  ?   Heart sounds: Normal heart sounds.  ?Pulmonary:  ?   Effort: Pulmonary effort is normal. No respiratory distress.  ?   Breath sounds: Normal breath sounds.  ?Abdominal:  ?   General: Bowel sounds are normal. There is no distension.  ?   Palpations: Abdomen is soft.  ?   Tenderness: There is no abdominal tenderness.  ?Skin: ?   General: Skin is warm and dry.  ?Neurological:  ?   Mental Status: She is alert and oriented to person, place, and time.  ?   Motor: No abnormal muscle tone.  ?   Coordination: Coordination normal.  ?   Deep Tendon Reflexes: Reflexes are normal and symmetric. Reflexes normal.  ?Psychiatric:     ?   Behavior: Behavior normal.     ?   Thought Content: Thought content normal.     ?   Judgment: Judgment normal.  ? ?Fetal Tracing: ? ?Baseline: 140 ?Variability: moderate ?Accels: 15x15 ?Decels: none ? ?Toco: none ? ?MAU Course  ?Procedures ?Results for orders placed or performed during the hospital encounter of 06/15/21 (from the past 24 hour(s))  ?CBC     Status: Abnormal  ? Collection Time: 06/15/21  7:27 AM  ?Result Value Ref Range  ? WBC 13.9 (H) 4.0 - 10.5 K/uL  ? RBC 3.31 (L) 3.87 - 5.11 MIL/uL  ? Hemoglobin 10.6 (L) 12.0 - 15.0 g/dL  ? HCT 31.7 (L) 36.0 - 46.0 %  ? MCV 95.8 80.0 - 100.0 fL  ? MCH  32.0 26.0 - 34.0 pg  ? MCHC 33.4 30.0 - 36.0 g/dL  ? RDW 13.8 11.5 - 15.5 %  ? Platelets 270 150 - 400 K/uL  ? nRBC 0.0 0.0 - 0.2 %  ?Comprehensive metabolic panel      Status: Abnormal  ? Collection Time: 06/15/21  7:27 AM  ?Result Value Ref Range  ? Sodium 136 135 - 145 mmol/L  ? Potassium 3.7 3.5 - 5.1 mmol/L  ? Chloride 106 98 - 111 mmol/L  ? CO2 24 22 - 32 mmol/L  ? Glucose, Bld 92 70 - 99 mg/dL  ? BUN 5 (L) 6 - 20 mg/dL  ? Creatinine, Ser 0.56 0.44 - 1.00 mg/dL  ? Calcium 9.1 8.9 - 10.3 mg/dL  ? Total Protein 6.3 (L) 6.5 - 8.1 g/dL  ? Albumin 2.8 (L) 3.5 - 5.0 g/dL  ? AST 11 (L) 15 - 41 U/L  ? ALT 12 0 - 44 U/L  ? Alkaline Phosphatase 100 38 - 126 U/L  ? Total Bilirubin 0.7 0.3 - 1.2 mg/dL  ? GFR, Estimated >60 >60 mL/min  ? Anion gap 6 5 - 15  ?Urinalysis, Routine w reflex microscopic Urine, Clean Catch     Status: Abnormal  ? Collection Time: 06/15/21  7:37 AM  ?Result Value Ref Range  ? Color, Urine YELLOW YELLOW  ? APPearance CLEAR CLEAR  ? Specific Gravity, Urine 1.014 1.005 - 1.030  ? pH 7.0 5.0 - 8.0  ? Glucose, UA NEGATIVE NEGATIVE mg/dL  ? Hgb urine dipstick NEGATIVE NEGATIVE  ? Bilirubin Urine NEGATIVE NEGATIVE  ? Ketones, ur NEGATIVE NEGATIVE mg/dL  ? Protein, ur NEGATIVE NEGATIVE mg/dL  ? Nitrite NEGATIVE NEGATIVE  ? Leukocytes,Ua TRACE (A) NEGATIVE  ? RBC / HPF 0-5 0 - 5 RBC/hpf  ? WBC, UA 0-5 0 - 5 WBC/hpf  ? Bacteria, UA NONE SEEN NONE SEEN  ? Squamous Epithelial / LPF 0-5 0 - 5  ? Mucus PRESENT   ?Protein / creatinine ratio, urine     Status: None  ? Collection Time: 06/15/21  7:37 AM  ?Result Value Ref Range  ? Creatinine, Urine 107.22 mg/dL  ? Total Protein, Urine 9 mg/dL  ? Protein Creatinine Ratio 0.08 0.00 - 0.15 mg/mg[Cre]  ?  ? ?MDM ?UA ?CBC, CMP, Protein/creat ratio ? ?Options for HA management reviewed with patient including IV headache cocktail and PO options. Patient desires IV management ? ?LR bolus ?Reglan IV ?Diphenhydramine IV ? ?Care turned over to Dr. Nehemiah Settle at 0800. ?Dellia Cloud ? ? ?Assessment and Plan  ? ?920 Checked on patient - headache a 7. Offered caffeine. Patient wanted more time to see if it would help. ?1020 - still 6/7 out of  10. Will give flexeril. ?1200 - Headache improved.  ? ?1. [redacted] weeks gestation of pregnancy   ?2. Acute nonintractable headache, unspecified headache type   ? ? ?Discharge to home ?Return precautions given.

## 2021-06-15 NOTE — MAU Note (Signed)
.  Gabriella Anderson is a 36 y.o. at 35w1dhere in MAU reporting: headache since Saturday-has taken baby ASA and Tylenol without relief. Denies visual changes. Diagnosed with Gestational HTN.  Also reports swelling of R knee-began Saturday night. ? ?Pt denies SROM, vaginal bleeding or discharge. Denies contractions or uterine cramping. Endorses + fetal movement. ? ?Pain score: 9 headache-anterior ?Vitals:  ? 06/15/21 0655  ?BP: (!) 143/55  ?Pulse: 63  ?Resp: 17  ?Temp: 98.1 ?F (36.7 ?C)  ?SpO2: 99%  ?   ?FHT:145 ?Lab orders placed from triage:   UA ?

## 2021-06-17 ENCOUNTER — Ambulatory Visit (INDEPENDENT_AMBULATORY_CARE_PROVIDER_SITE_OTHER): Payer: Medicaid Other

## 2021-06-17 VITALS — BP 116/70 | HR 74 | Wt 223.0 lb

## 2021-06-17 DIAGNOSIS — O133 Gestational [pregnancy-induced] hypertension without significant proteinuria, third trimester: Secondary | ICD-10-CM

## 2021-06-17 DIAGNOSIS — R87612 Low grade squamous intraepithelial lesion on cytologic smear of cervix (LGSIL): Secondary | ICD-10-CM

## 2021-06-17 DIAGNOSIS — Z3480 Encounter for supervision of other normal pregnancy, unspecified trimester: Secondary | ICD-10-CM

## 2021-06-17 DIAGNOSIS — Z3A30 30 weeks gestation of pregnancy: Secondary | ICD-10-CM

## 2021-06-17 NOTE — Progress Notes (Signed)
Rob, c/o swollen knees, ankles and HA. ?

## 2021-06-17 NOTE — Progress Notes (Signed)
? ?  PRENATAL VISIT NOTE ? ?Subjective:  ?Gabriella Anderson is a 36 y.o. G3P1011 at 53w3dbeing seen today for ongoing prenatal care.  She is currently monitored for the following issues for this low-risk pregnancy and has Tobacco abuse; History of gestational hypertension; Supervision of other normal pregnancy, antepartum; and LGSIL on Pap smear of cervix on their problem list. ? ?Patient reports no complaints.  Contractions: Not present. Vag. Bleeding: None.  Movement: Present. Denies leaking of fluid.  ? ?The following portions of the patient's history were reviewed and updated as appropriate: allergies, current medications, past family history, past medical history, past social history, past surgical history and problem list.  ? ?Objective:  ? ?Vitals:  ? 06/17/21 1456 06/17/21 1510  ?BP: (!) 141/70 116/70  ?Pulse: 73 74  ?Weight: 223 lb (101.2 kg)   ? ? ?Fetal Status: Fetal Heart Rate (bpm): 146 Fundal Height: 30 cm Movement: Present    ? ?General:  Alert, oriented and cooperative. Patient is in no acute distress.  ?Skin: Skin is warm and dry. No rash noted.   ?Cardiovascular: Normal heart rate noted  ?Respiratory: Normal respiratory effort, no problems with respiration noted  ?Abdomen: Soft, gravid, appropriate for gestational age.  Pain/Pressure: Absent     ?Pelvic: Cervical exam deferred        ?Extremities: Normal range of motion.  Edema: Trace  ?Mental Status: Normal mood and affect. Normal behavior. Normal judgment and thought content.  ? ?Assessment and Plan:  ?Pregnancy: G3P1011 at 370w3d1. Supervision of other normal pregnancy, antepartum ?- Routine OB. Doing well.  ?- Recent MAU visit for elevated BP and headache. Headache has since resolved ?- Anticipatory guidance for upcoming appointment provided ? ?2. [redacted] weeks gestation of pregnancy ?- Endorses active fetal movement ?- FH appropriate ? ?3. Gestational hypertension, third trimester ?- Has hx of gHTN ?- 1 elevated BP in MAU on 4/17 and 1 BP  elevated today. Repeat BP's normotensive. Patient asymptomatic and had normal labs on Monday. Technically meets criteria for gHTN today.  ?- Will need antenatal testing ?- Continue bASA ?- Reviewed pre-e precautions at length with patient ? ?4. LGSIL on Pap smear of cervix ?- Plan for PP colpo ? ? ?Preterm labor symptoms and general obstetric precautions including but not limited to vaginal bleeding, contractions, leaking of fluid and fetal movement were reviewed in detail with the patient. ?Please refer to After Visit Summary for other counseling recommendations.  ? ?Return in about 2 weeks (around 07/01/2021). ? ?Future Appointments  ?Date Time Provider [place]?07/01/2021  3:30 PM WeLuvenia ReddenPA-C [facility] None  ?07/20/2021  3:30 PM [facility] NURSE [facility]  ?07/20/2021  3:45 PM [facility] US5 [facility]  ? ? ?DaRenee HarderCNM ? ? ?

## 2021-06-18 ENCOUNTER — Other Ambulatory Visit: Payer: Self-pay

## 2021-06-18 DIAGNOSIS — O133 Gestational [pregnancy-induced] hypertension without significant proteinuria, third trimester: Secondary | ICD-10-CM

## 2021-06-23 ENCOUNTER — Other Ambulatory Visit: Payer: Self-pay

## 2021-06-23 DIAGNOSIS — O133 Gestational [pregnancy-induced] hypertension without significant proteinuria, third trimester: Secondary | ICD-10-CM

## 2021-06-30 ENCOUNTER — Ambulatory Visit: Payer: Medicaid Other | Admitting: *Deleted

## 2021-06-30 ENCOUNTER — Ambulatory Visit: Payer: Medicaid Other

## 2021-06-30 VITALS — BP 132/68 | HR 81

## 2021-06-30 DIAGNOSIS — D259 Leiomyoma of uterus, unspecified: Secondary | ICD-10-CM | POA: Insufficient documentation

## 2021-06-30 DIAGNOSIS — O09293 Supervision of pregnancy with other poor reproductive or obstetric history, third trimester: Secondary | ICD-10-CM | POA: Insufficient documentation

## 2021-06-30 DIAGNOSIS — O133 Gestational [pregnancy-induced] hypertension without significant proteinuria, third trimester: Secondary | ICD-10-CM | POA: Diagnosis not present

## 2021-06-30 DIAGNOSIS — Z3480 Encounter for supervision of other normal pregnancy, unspecified trimester: Secondary | ICD-10-CM

## 2021-06-30 DIAGNOSIS — Z3A32 32 weeks gestation of pregnancy: Secondary | ICD-10-CM | POA: Diagnosis not present

## 2021-06-30 DIAGNOSIS — O3413 Maternal care for benign tumor of corpus uteri, third trimester: Secondary | ICD-10-CM | POA: Diagnosis not present

## 2021-07-01 ENCOUNTER — Other Ambulatory Visit: Payer: Self-pay | Admitting: *Deleted

## 2021-07-01 ENCOUNTER — Encounter: Payer: Self-pay | Admitting: Medical

## 2021-07-01 ENCOUNTER — Ambulatory Visit (INDEPENDENT_AMBULATORY_CARE_PROVIDER_SITE_OTHER): Payer: Medicaid Other | Admitting: Medical

## 2021-07-01 VITALS — BP 133/78 | HR 74 | Wt 225.0 lb

## 2021-07-01 DIAGNOSIS — D259 Leiomyoma of uterus, unspecified: Secondary | ICD-10-CM

## 2021-07-01 DIAGNOSIS — O133 Gestational [pregnancy-induced] hypertension without significant proteinuria, third trimester: Secondary | ICD-10-CM

## 2021-07-01 DIAGNOSIS — R87612 Low grade squamous intraepithelial lesion on cytologic smear of cervix (LGSIL): Secondary | ICD-10-CM

## 2021-07-01 DIAGNOSIS — Z3480 Encounter for supervision of other normal pregnancy, unspecified trimester: Secondary | ICD-10-CM

## 2021-07-01 DIAGNOSIS — Z3A32 32 weeks gestation of pregnancy: Secondary | ICD-10-CM

## 2021-07-01 NOTE — Progress Notes (Signed)
? ?  PRENATAL VISIT NOTE ? ?Subjective:  ?Gabriella Anderson is a 36 y.o. G3P1011 at 73w3dbeing seen today for ongoing prenatal care.  She is currently monitored for the following issues for this high-risk pregnancy and has Tobacco abuse; Gestational hypertension w/o significant proteinuria in 3rd trimester; Supervision of other normal pregnancy, antepartum; LGSIL on Pap smear of cervix; and Fibroid uterus on their problem list. ? ?Patient reports fatigue.  Contractions: Not present. Vag. Bleeding: None.  Movement: Present. Denies leaking of fluid.  ? ?The following portions of the patient's history were reviewed and updated as appropriate: allergies, current medications, past family history, past medical history, past social history, past surgical history and problem list.  ? ?Objective:  ? ?Vitals:  ? 07/01/21 1522  ?BP: 133/78  ?Pulse: 74  ?Weight: 225 lb (102.1 kg)  ? ? ?Fetal Status: Fetal Heart Rate (bpm): 142   Movement: Present    ? ?General:  Alert, oriented and cooperative. Patient is in no acute distress.  ?Skin: Skin is warm and dry. No rash noted.   ?Cardiovascular: Normal heart rate noted  ?Respiratory: Normal respiratory effort, no problems with respiration noted  ?Abdomen: Soft, gravid, appropriate for gestational age.  Pain/Pressure: Absent     ?Pelvic: Cervical exam deferred        ?Extremities: Normal range of motion.  Edema: Trace  ?Mental Status: Normal mood and affect. Normal behavior. Normal judgment and thought content.  ? ?Assessment and Plan:  ?Pregnancy: G3P1011 at 375w3d1. Supervision of other normal pregnancy, antepartum ?- Uses [facility] for Peds ?- BTL consent signed previously, discussed procedure briefly, patient is concerned about increased flow after procedure with future periods ? ?2. Gestational hypertension w/o significant proteinuria in 3rd trimester ?- Weekly BPP with MFM ?- BPP yesterday 8/8 ?- Growth [place] 5/10 with next BPP ?- Plan for IOL in 37th week  ? ?3. LGSIL on  Pap smear of cervix ?- Needs PP colpo  ? ?4. Uterine leiomyoma, unspecified location ?- Growth US[place]/10 ? ?5. [redacted] weeks gestation of pregnancy ? ?Preterm labor symptoms and general obstetric precautions including but not limited to vaginal bleeding, contractions, leaking of fluid and fetal movement were reviewed in detail with the patient. ?Please refer to After Visit Summary for other counseling recommendations.  ? ?Return in about 2 weeks (around 07/15/2021) [facility]. ? ?Future Appointments  ?Date Time Provider [place]?07/08/2021 10:30 AM [facility] NURSE [facility]  ?07/08/2021 10:45 AM [facility] US4 [facility]  ?07/15/2021 10:15 AM [facility] NURSE [facility]  ?07/15/2021 10:30 AM [facility] US2 [facility]  ?07/20/2021  3:30 PM [facility] NURSE [facility]  ?07/20/2021  3:45 PM [facility] US5 [facility]  ?07/29/2021  8:30 AM [facility] NURSE [facility]  ?07/29/2021  8:45 AM [facility] US5 [facility]  ?08/03/2021  7:45 AM [facility] NURSE [facility]  ?08/03/2021  8:00 AM [facility] US1 [facility]  ? ? ?JuKerry HoughPA-C ? ?

## 2021-07-01 NOTE — Progress Notes (Signed)
ROB, wants to know if she will be induced at 37 weeks? ?

## 2021-07-08 ENCOUNTER — Encounter: Payer: Self-pay | Admitting: *Deleted

## 2021-07-08 ENCOUNTER — Ambulatory Visit: Payer: Medicaid Other | Admitting: *Deleted

## 2021-07-08 ENCOUNTER — Ambulatory Visit: Payer: Medicaid Other | Attending: Obstetrics

## 2021-07-08 VITALS — BP 133/70 | HR 72

## 2021-07-08 DIAGNOSIS — O133 Gestational [pregnancy-induced] hypertension without significant proteinuria, third trimester: Secondary | ICD-10-CM

## 2021-07-08 DIAGNOSIS — O09293 Supervision of pregnancy with other poor reproductive or obstetric history, third trimester: Secondary | ICD-10-CM

## 2021-07-08 DIAGNOSIS — O283 Abnormal ultrasonic finding on antenatal screening of mother: Secondary | ICD-10-CM | POA: Diagnosis present

## 2021-07-08 DIAGNOSIS — O3413 Maternal care for benign tumor of corpus uteri, third trimester: Secondary | ICD-10-CM

## 2021-07-08 DIAGNOSIS — Z8759 Personal history of other complications of pregnancy, childbirth and the puerperium: Secondary | ICD-10-CM | POA: Diagnosis present

## 2021-07-08 DIAGNOSIS — Z362 Encounter for other antenatal screening follow-up: Secondary | ICD-10-CM

## 2021-07-08 DIAGNOSIS — D259 Leiomyoma of uterus, unspecified: Secondary | ICD-10-CM | POA: Diagnosis present

## 2021-07-08 DIAGNOSIS — Z3A33 33 weeks gestation of pregnancy: Secondary | ICD-10-CM

## 2021-07-15 ENCOUNTER — Ambulatory Visit: Payer: Medicaid Other | Attending: Obstetrics

## 2021-07-15 ENCOUNTER — Encounter: Payer: Self-pay | Admitting: *Deleted

## 2021-07-15 ENCOUNTER — Other Ambulatory Visit: Payer: Self-pay | Admitting: Obstetrics

## 2021-07-15 ENCOUNTER — Ambulatory Visit: Payer: Medicaid Other | Admitting: *Deleted

## 2021-07-15 ENCOUNTER — Telehealth (INDEPENDENT_AMBULATORY_CARE_PROVIDER_SITE_OTHER): Payer: Medicaid Other | Admitting: Obstetrics & Gynecology

## 2021-07-15 VITALS — BP 145/73 | HR 72

## 2021-07-15 VITALS — BP 132/84 | HR 71

## 2021-07-15 DIAGNOSIS — D259 Leiomyoma of uterus, unspecified: Secondary | ICD-10-CM | POA: Diagnosis not present

## 2021-07-15 DIAGNOSIS — O133 Gestational [pregnancy-induced] hypertension without significant proteinuria, third trimester: Secondary | ICD-10-CM | POA: Insufficient documentation

## 2021-07-15 DIAGNOSIS — Z8759 Personal history of other complications of pregnancy, childbirth and the puerperium: Secondary | ICD-10-CM | POA: Insufficient documentation

## 2021-07-15 DIAGNOSIS — O09293 Supervision of pregnancy with other poor reproductive or obstetric history, third trimester: Secondary | ICD-10-CM

## 2021-07-15 DIAGNOSIS — O3413 Maternal care for benign tumor of corpus uteri, third trimester: Secondary | ICD-10-CM | POA: Diagnosis not present

## 2021-07-15 DIAGNOSIS — Z3A34 34 weeks gestation of pregnancy: Secondary | ICD-10-CM

## 2021-07-15 DIAGNOSIS — Z3480 Encounter for supervision of other normal pregnancy, unspecified trimester: Secondary | ICD-10-CM

## 2021-07-15 DIAGNOSIS — O283 Abnormal ultrasonic finding on antenatal screening of mother: Secondary | ICD-10-CM

## 2021-07-15 DIAGNOSIS — Z362 Encounter for other antenatal screening follow-up: Secondary | ICD-10-CM

## 2021-07-15 NOTE — Progress Notes (Signed)
I connected with Gabriella Anderson 07/15/21 at  8:55 AM EDT by: MyChart video and verified that I am speaking with the correct person using two identifiers.  Patient is located at home and provider is located at [place].     I discussed the limitations, risks, security and privacy concerns of performing an evaluation and management service by MyChart video and the availability of in person appointments. I also discussed with the patient that there may be a patient responsible charge related to this service. By engaging in this virtual visit, you consent to the provision of healthcare.  Additionally, you authorize for your insurance to be billed for the services provided during this visit.  The patient expressed understanding and agreed to proceed.  The following staff members participated in the virtual visit:  Roselie Awkward MD    PRENATAL VISIT NOTE  Subjective:  Gabriella Anderson is a 36 y.o. G3P1011 at [redacted]w[redacted]d for virtual visit for ongoing prenatal care.  She is currently monitored for the following issues for this high-risk pregnancy and has Tobacco abuse; Gestational hypertension w/o significant proteinuria in 3rd trimester; Supervision of other normal pregnancy, antepartum; LGSIL on Pap smear of cervix; and Fibroid uterus on their problem list.  Patient reports  LE edema .  Contractions: Irritability. Vag. Bleeding: None.  Movement: Present. Denies leaking of fluid.   The following portions of the patient's history were reviewed and updated as appropriate: allergies, current medications, past family history, past medical history, past social history, past surgical history and problem list.   Objective:   Vitals:   07/15/21 0837 07/15/21 0840  BP: (!) 151/78 132/84  Pulse: 71    Self-Obtained  Fetal Status:     Movement: Present     Assessment and Plan:  Pregnancy: G3P1011 at 385w3d. Supervision of other normal pregnancy, antepartum Follow BP, BPP today  2. Gestational hypertension w/o  significant proteinuria in 3rd trimester Anticipate IOL 37 week  Preterm labor symptoms and general obstetric precautions including but not limited to vaginal bleeding, contractions, leaking of fluid and fetal movement were reviewed in detail with the patient.  Return in about 1 week (around 07/22/2021).  Future Appointments  Date Time Provider De[place]5/17/2023 10:15 AM [facility] NURSE [facility]5/17/2023 10:30 AM [facility]/20[facility]  3:30 PM [facility] NURSE [facility]5/22/2023  3:45 PM [facility] US[facility]5/31/2023  8:30 AM [facility] NURSE [facility]/31/2023  8:45 AM [facility] US5 [facility]6/06/2021  7:45 AM [facility] NURSE [facility]6/[facility]/2021  8:00 AM [facility] US1 [facility]MFCUS WMM[place]   Time spent on virtual visit: 10 minutes  JaEmeterio ReeveMD Patient ID: CoTiajuana Amassfemale   DOB: 07/1985/[id number].o.   MRN: [id number]

## 2021-07-15 NOTE — Progress Notes (Signed)
Pt states she had increase in swelling in LE.  Pt denies HA, visual changes.   Please discuss IOL.

## 2021-07-20 ENCOUNTER — Ambulatory Visit: Payer: Medicaid Other | Attending: Obstetrics

## 2021-07-20 ENCOUNTER — Ambulatory Visit: Payer: Medicaid Other | Admitting: *Deleted

## 2021-07-20 ENCOUNTER — Encounter: Payer: Self-pay | Admitting: *Deleted

## 2021-07-20 VITALS — BP 141/74 | HR 66

## 2021-07-20 DIAGNOSIS — O133 Gestational [pregnancy-induced] hypertension without significant proteinuria, third trimester: Secondary | ICD-10-CM

## 2021-07-20 DIAGNOSIS — O3413 Maternal care for benign tumor of corpus uteri, third trimester: Secondary | ICD-10-CM | POA: Insufficient documentation

## 2021-07-20 DIAGNOSIS — Z3A35 35 weeks gestation of pregnancy: Secondary | ICD-10-CM | POA: Insufficient documentation

## 2021-07-20 DIAGNOSIS — Z3480 Encounter for supervision of other normal pregnancy, unspecified trimester: Secondary | ICD-10-CM

## 2021-07-20 DIAGNOSIS — D259 Leiomyoma of uterus, unspecified: Secondary | ICD-10-CM | POA: Insufficient documentation

## 2021-07-20 DIAGNOSIS — O09293 Supervision of pregnancy with other poor reproductive or obstetric history, third trimester: Secondary | ICD-10-CM | POA: Diagnosis not present

## 2021-07-23 ENCOUNTER — Ambulatory Visit (INDEPENDENT_AMBULATORY_CARE_PROVIDER_SITE_OTHER): Payer: Medicaid Other | Admitting: Obstetrics & Gynecology

## 2021-07-23 ENCOUNTER — Encounter: Payer: Self-pay | Admitting: Obstetrics & Gynecology

## 2021-07-23 ENCOUNTER — Encounter (HOSPITAL_COMMUNITY): Payer: Self-pay

## 2021-07-23 ENCOUNTER — Encounter (HOSPITAL_COMMUNITY): Payer: Self-pay | Admitting: *Deleted

## 2021-07-23 ENCOUNTER — Other Ambulatory Visit (HOSPITAL_COMMUNITY)
Admission: RE | Admit: 2021-07-23 | Discharge: 2021-07-23 | Disposition: A | Discharge status: Home / Self Care | Payer: Medicaid Other | Source: Ambulatory Visit | Attending: Obstetrics & Gynecology | Admitting: Obstetrics & Gynecology

## 2021-07-23 ENCOUNTER — Telehealth (HOSPITAL_COMMUNITY): Payer: Self-pay | Admitting: *Deleted

## 2021-07-23 VITALS — BP 135/83 | HR 73 | Wt 233.0 lb

## 2021-07-23 DIAGNOSIS — O133 Gestational [pregnancy-induced] hypertension without significant proteinuria, third trimester: Secondary | ICD-10-CM

## 2021-07-23 DIAGNOSIS — Z3480 Encounter for supervision of other normal pregnancy, unspecified trimester: Secondary | ICD-10-CM

## 2021-07-23 DIAGNOSIS — Z3A35 35 weeks gestation of pregnancy: Secondary | ICD-10-CM

## 2021-07-23 LAB — CERVICOVAGINAL ANCILLARY ONLY
Chlamydia: NEGATIVE
Comment: NEGATIVE
Comment: NORMAL
Neisseria Gonorrhea: NEGATIVE

## 2021-07-23 NOTE — Telephone Encounter (Signed)
Preadmission screen  

## 2021-07-23 NOTE — Progress Notes (Signed)
   PRENATAL VISIT NOTE  Subjective:  Gabriella Anderson is a 36 y.o. G3P1011 at 70w4dbeing seen today for ongoing prenatal care.  She is currently monitored for the following issues for this high-risk pregnancy and has Tobacco abuse; Gestational hypertension w/o significant proteinuria in 3rd trimester; Supervision of other normal pregnancy, antepartum; LGSIL on Pap smear of cervix; and Fibroid uterus on their problem list.  Patient reports no complaints.  Contractions: Irritability. Vag. Bleeding: None.  Movement: Present. Denies leaking of fluid.   The following portions of the patient's history were reviewed and updated as appropriate: allergies, current medications, past family history, past medical history, past social history, past surgical history and problem list.   Objective:   Vitals:   07/23/21 0859 07/23/21 0902  BP: (!) 150/88 135/83  Pulse: 81 73  Weight: 233 lb (105.7 kg) 233 lb (105.7 kg)    Fetal Status: Fetal Heart Rate (bpm): 139   Movement: Present     General:  Alert, oriented and cooperative. Patient is in no acute distress.  Skin: Skin is warm and dry. No rash noted.   Cardiovascular: Normal heart rate noted  Respiratory: Normal respiratory effort, no problems with respiration noted  Abdomen: Soft, gravid, appropriate for gestational age.  Pain/Pressure: Present     Pelvic: Cervical exam deferred        Extremities: Normal range of motion.  Edema: Mild pitting, slight indentation  Mental Status: Normal mood and affect. Normal behavior. Normal judgment and thought content.   Assessment and Plan:  Pregnancy: G3P1011 at 336w4d. Gestational hypertension w/o significant proteinuria in 3rd trimester IOL 37 weeks  2. Supervision of other normal pregnancy, antepartum 36 week testing - Culture, beta strep (group b only) - Cervicovaginal ancillary only( [place])  Preterm labor symptoms and general obstetric precautions including but not limited to vaginal  bleeding, contractions, leaking of fluid and fetal movement were reviewed in detail with the patient. Please refer to After Visit Summary for other counseling recommendations.   Return in about 1 week (around 07/30/2021).  Future Appointments  Date Time Provider De[place]mera5/31/2023  8:30 AM [facility]5/31/2023  8:45 AM [facility] US5 [facility]6/06/2021  7:45 AM [facility] NURSE [facility]6/06/2021  8:00 AM [facility] US1 [facility]-M[facility]US WMC    JaEmeterio ReeveMD

## 2021-07-23 NOTE — Progress Notes (Signed)
ROB GBS 

## 2021-07-27 LAB — CULTURE, BETA STREP (GROUP B ONLY): Strep Gp B Culture: NEGATIVE

## 2021-07-29 ENCOUNTER — Other Ambulatory Visit: Payer: Self-pay | Admitting: Advanced Practice Midwife

## 2021-07-29 ENCOUNTER — Ambulatory Visit: Payer: Medicaid Other | Admitting: *Deleted

## 2021-07-29 ENCOUNTER — Ambulatory Visit: Payer: Medicaid Other | Attending: Obstetrics

## 2021-07-29 ENCOUNTER — Encounter: Payer: Self-pay | Admitting: *Deleted

## 2021-07-29 ENCOUNTER — Other Ambulatory Visit: Payer: Self-pay | Admitting: Obstetrics

## 2021-07-29 VITALS — BP 144/81 | HR 62

## 2021-07-29 DIAGNOSIS — O3412 Maternal care for benign tumor of corpus uteri, second trimester: Secondary | ICD-10-CM

## 2021-07-29 DIAGNOSIS — O09293 Supervision of pregnancy with other poor reproductive or obstetric history, third trimester: Secondary | ICD-10-CM

## 2021-07-29 DIAGNOSIS — O133 Gestational [pregnancy-induced] hypertension without significant proteinuria, third trimester: Secondary | ICD-10-CM | POA: Insufficient documentation

## 2021-07-29 DIAGNOSIS — D259 Leiomyoma of uterus, unspecified: Secondary | ICD-10-CM

## 2021-07-29 DIAGNOSIS — Z3A36 36 weeks gestation of pregnancy: Secondary | ICD-10-CM

## 2021-08-02 ENCOUNTER — Inpatient Hospital Stay (HOSPITAL_COMMUNITY): Payer: Medicaid Other

## 2021-08-02 ENCOUNTER — Encounter (HOSPITAL_COMMUNITY): Payer: Self-pay | Admitting: Obstetrics and Gynecology

## 2021-08-02 ENCOUNTER — Inpatient Hospital Stay (HOSPITAL_COMMUNITY)
Admission: AD | Admit: 2021-08-02 | Discharge: 2021-08-04 | DRG: 807 | Disposition: A | Discharge status: Home / Self Care | Payer: Medicaid Other | Attending: Obstetrics and Gynecology | Admitting: Obstetrics and Gynecology

## 2021-08-02 ENCOUNTER — Other Ambulatory Visit: Payer: Self-pay

## 2021-08-02 DIAGNOSIS — Z3A37 37 weeks gestation of pregnancy: Secondary | ICD-10-CM

## 2021-08-02 DIAGNOSIS — O133 Gestational [pregnancy-induced] hypertension without significant proteinuria, third trimester: Secondary | ICD-10-CM

## 2021-08-02 DIAGNOSIS — O139 Gestational [pregnancy-induced] hypertension without significant proteinuria, unspecified trimester: Secondary | ICD-10-CM

## 2021-08-02 DIAGNOSIS — Z87891 Personal history of nicotine dependence: Secondary | ICD-10-CM

## 2021-08-02 DIAGNOSIS — D259 Leiomyoma of uterus, unspecified: Secondary | ICD-10-CM | POA: Diagnosis present

## 2021-08-02 DIAGNOSIS — O134 Gestational [pregnancy-induced] hypertension without significant proteinuria, complicating childbirth: Secondary | ICD-10-CM | POA: Diagnosis not present

## 2021-08-02 DIAGNOSIS — Z7982 Long term (current) use of aspirin: Secondary | ICD-10-CM

## 2021-08-02 DIAGNOSIS — R87612 Low grade squamous intraepithelial lesion on cytologic smear of cervix (LGSIL): Secondary | ICD-10-CM | POA: Diagnosis present

## 2021-08-02 DIAGNOSIS — O3413 Maternal care for benign tumor of corpus uteri, third trimester: Secondary | ICD-10-CM | POA: Diagnosis present

## 2021-08-02 LAB — COMPREHENSIVE METABOLIC PANEL
ALT: 9 U/L (ref 0–44)
AST: 15 U/L (ref 15–41)
Albumin: 2.5 g/dL — ABNORMAL LOW (ref 3.5–5.0)
Alkaline Phosphatase: 241 U/L — ABNORMAL HIGH (ref 38–126)
Anion gap: 8 (ref 5–15)
BUN: 5 mg/dL — ABNORMAL LOW (ref 6–20)
CO2: 21 mmol/L — ABNORMAL LOW (ref 22–32)
Calcium: 8.9 mg/dL (ref 8.9–10.3)
Chloride: 108 mmol/L (ref 98–111)
Creatinine, Ser: 0.51 mg/dL (ref 0.44–1.00)
GFR, Estimated: >60 mL/min (ref >60)
Glucose, Bld: 85 mg/dL (ref 70–99)
Potassium: 3.8 mmol/L (ref 3.5–5.1)
Sodium: 137 mmol/L (ref 135–145)
Total Bilirubin: 0.6 mg/dL (ref 0.3–1.2)
Total Protein: 5.8 g/dL — ABNORMAL LOW (ref 6.5–8.1)

## 2021-08-02 LAB — TYPE AND SCREEN
ABO/RH(D): B POS
Antibody Screen: NEGATIVE

## 2021-08-02 LAB — CBC
HCT: 32 % — ABNORMAL LOW (ref 36.0–46.0)
Hemoglobin: 10.6 g/dL — ABNORMAL LOW (ref 12.0–15.0)
MCH: 31.8 pg (ref 26.0–34.0)
MCHC: 33.1 g/dL (ref 30.0–36.0)
MCV: 96.1 fL (ref 80.0–100.0)
Platelets: 244 10*3/uL (ref 150–400)
RBC: 3.33 MIL/uL — ABNORMAL LOW (ref 3.87–5.11)
RDW: 13.7 % (ref 11.5–15.5)
WBC: 12.8 10*3/uL — ABNORMAL HIGH (ref 4.0–10.5)
nRBC: 0 % (ref 0.0–0.2)

## 2021-08-02 LAB — RPR: RPR Ser Ql: NONREACTIVE

## 2021-08-02 LAB — PROTEIN / CREATININE RATIO, URINE
Creatinine, Urine: 76.78 mg/dL
Protein Creatinine Ratio: 0.17 mg/mg{Cre} — ABNORMAL HIGH (ref 0.00–0.15)
Total Protein, Urine: 13 mg/dL

## 2021-08-02 MED ORDER — MISOPROSTOL 50MCG HALF TABLET: 50.0000 ug | ORAL_TABLET | ORAL | Status: DC

## 2021-08-02 MED ORDER — HYDRALAZINE HCL 20 MG/ML IJ SOLN: 5.0000 mg | INTRAMUSCULAR | Status: DC | PRN

## 2021-08-02 MED ORDER — OXYCODONE-ACETAMINOPHEN 5-325 MG PO TABS: 2.0000 | ORAL_TABLET | ORAL | Status: DC | PRN

## 2021-08-02 MED ORDER — OXYTOCIN BOLUS FROM INFUSION
333.0000 mL | Freq: Once | INTRAVENOUS | Status: AC
  Administered 2021-08-02: 333 mL via INTRAVENOUS

## 2021-08-02 MED ORDER — MISOPROSTOL 25 MCG QUARTER TABLET: 25.0000 ug | ORAL_TABLET | ORAL | Status: DC | PRN

## 2021-08-02 MED ORDER — FENTANYL CITRATE (PF) 100 MCG/2ML IJ SOLN: 100.0000 ug | INTRAMUSCULAR | Status: DC | PRN

## 2021-08-02 MED ORDER — OXYTOCIN-SODIUM CHLORIDE 30-0.9 UT/500ML-% IV SOLN
1.0000 m[IU]/min | INTRAVENOUS | Status: DC
  Administered 2021-08-02: 2 m[IU]/min via INTRAVENOUS
  Filled 2021-08-02: qty 500

## 2021-08-02 MED ORDER — MISOPROSTOL 50MCG HALF TABLET
50.0000 ug | ORAL_TABLET | ORAL | Status: DC
  Administered 2021-08-02: 50 ug via BUCCAL
  Filled 2021-08-02: qty 1

## 2021-08-02 MED ORDER — LABETALOL HCL 5 MG/ML IV SOLN: 40.0000 mg | INTRAVENOUS | Status: DC | PRN

## 2021-08-02 MED ORDER — OXYTOCIN-SODIUM CHLORIDE 30-0.9 UT/500ML-% IV SOLN: 2.5000 [IU]/h | INTRAVENOUS | Status: DC

## 2021-08-02 MED ORDER — TERBUTALINE SULFATE 1 MG/ML IJ SOLN: 0.2500 mg | Freq: Once | INTRAMUSCULAR | Status: DC | PRN

## 2021-08-02 MED ORDER — LACTATED RINGERS IV SOLN: INTRAVENOUS | Status: DC

## 2021-08-02 MED ORDER — LIDOCAINE HCL (PF) 1 % IJ SOLN: 30.0000 mL | INTRAMUSCULAR | Status: DC | PRN

## 2021-08-02 MED ORDER — SOD CITRATE-CITRIC ACID 500-334 MG/5ML PO SOLN: 30.0000 mL | ORAL | Status: DC | PRN

## 2021-08-02 MED ORDER — HYDRALAZINE HCL 20 MG/ML IJ SOLN: 10.0000 mg | INTRAMUSCULAR | Status: DC | PRN

## 2021-08-02 MED ORDER — IBUPROFEN 600 MG PO TABS
600.0000 mg | ORAL_TABLET | Freq: Four times a day (QID) | ORAL | Status: DC
  Administered 2021-08-02 – 2021-08-04 (×5): 600 mg via ORAL
  Filled 2021-08-02 (×7): qty 1

## 2021-08-02 MED ORDER — ACETAMINOPHEN 325 MG PO TABS: 650.0000 mg | ORAL_TABLET | ORAL | Status: DC | PRN

## 2021-08-02 MED ORDER — ONDANSETRON HCL 4 MG/2ML IJ SOLN: 4.0000 mg | Freq: Four times a day (QID) | INTRAMUSCULAR | Status: DC | PRN

## 2021-08-02 MED ORDER — LACTATED RINGERS IV SOLN
500.0000 mL | INTRAVENOUS | Status: DC | PRN
  Administered 2021-08-02: 1000 mL via INTRAVENOUS

## 2021-08-02 MED ORDER — BENZOCAINE-MENTHOL 20-0.5 % EX AERO
1.0000 "application " | INHALATION_SPRAY | CUTANEOUS | Status: DC | PRN
  Administered 2021-08-02: 1 via TOPICAL
  Filled 2021-08-02: qty 56

## 2021-08-02 MED ORDER — LABETALOL HCL 5 MG/ML IV SOLN: 20.0000 mg | INTRAVENOUS | Status: DC | PRN

## 2021-08-02 MED ORDER — OXYCODONE-ACETAMINOPHEN 5-325 MG PO TABS: 1.0000 | ORAL_TABLET | ORAL | Status: DC | PRN

## 2021-08-02 NOTE — H&P (Signed)
OBSTETRIC ADMISSION HISTORY AND PHYSICAL  Gabriella Anderson is a 36 y.o. female G76P1011 with IUP at 80w0dpresenting for induction of labor for gestational hypertension. She reports +FMs. No LOF, VB, blurry vision, headaches, peripheral edema, or RUQ pain. She plans on breastfeeding. She requests Paraguard IUD for birth control (has changed her mind about the BTL).  Dating: By U/S at 524w2d-->  Estimated Date of Delivery: 08/23/21  Sono:  07/29/21 '@[redacted]w[redacted]d'$ , normal anatomy, cephalic presentation, [id number]%ile, EFW 7lb 8oz  Prenatal History/Complications: gHTN, no s/sx PEC  Past Medical History: Past Medical History:  Diagnosis Date   Anemia    Fibroid    Hypertension    Infection    UTI   Trichimoniasis 04/11/2014   Vaginal Pap smear, abnormal    Past Surgical History: Past Surgical History:  Procedure Laterality Date   NO PAST SURGERIES     WISDOM TOOTH EXTRACTION Bilateral 2005   Obstetrical History: OB History     Gravida  3   Para  1   Term  1   Preterm  0   AB  1   Living  1      SAB  0   IAB  0   Ectopic  0   Multiple  0   Live Births  1          Social History: Social History   Socioeconomic History   Marital status: Single    Spouse name: Not on file   Number of children: 1   Years of education: Not on file   Highest education level: Some college, no degree  Occupational History   Not on file  Tobacco Use   Smoking status: Former    Packs/day: 0.25    Years: 10.00    Pack years: 2.50    Types: Cigarettes    Passive exposure: Never   Smokeless tobacco: Never  Vaping Use   Vaping Use: Never used  Substance and Sexual Activity   Alcohol use: Not Currently    Comment: ETOH use during early pregnancy   Drug use: Yes    Frequency: 2.0 times per week    Types: Marijuana    Comment: unknown   Sexual activity: Yes    Birth control/protection: None  Other Topics Concern   Not on file  Social History Narrative   Not on file    Social Determinants of Health   Financial Resource Strain: Not on file  Food Insecurity: Not on file  Transportation Needs: Not on file  Physical Activity: Not on file  Stress: Not on file  Social Connections: Not on file   Family History: Family History  Problem Relation Age of Onset   Hypertension Mother    Cancer Mother        Leukemia   Diabetes Maternal Grandfather    Hypertension Maternal Grandfather    Diabetes type II Maternal Grandfather    Hearing loss Neg Hx    Allergies: No Known Allergies  Medications Prior to Admission  Medication Sig Dispense Refill Last Dose   acetaminophen (TYLENOL) 500 MG tablet Take 1,000 mg by mouth daily as needed for headache (pain).   08/01/2021   aspirin EC 81 MG tablet Take 81 mg by mouth every morning. Swallow whole.   08/02/2021   cyclobenzaprine (FLEXERIL) 10 MG tablet Take 1 tablet (10 mg total) by mouth 3 (three) times daily as needed (headache). (Patient taking differently: Take 10 mg by mouth daily as needed (headache).)  30 tablet 2 07/31/2021   Prenatal Vit-Fe Fumarate-FA (MULTIVITAMIN-PRENATAL) 27-0.8 MG TABS tablet Take 1 tablet by mouth daily.   08/01/2021   Blood Pressure Monitoring (BLOOD PRESSURE MONITOR AUTOMAT) DEVI 1 Device by Does not apply route daily. Automatic blood pressure cuff regular size. To monitor blood pressure regularly at home. ICD-10 code:Z34.90 1 each 0    Misc. Devices (GOJJI WEIGHT SCALE) MISC 1 Device by Does not apply route daily as needed. To weight self daily as needed at home. ICD-10 code: Z34.90 (Patient not taking: Reported on 06/30/2021) 1 each 0    Review of Systems: All systems reviewed and negative except as stated in HPI  PE: Blood pressure (!) 145/87, pulse 62, temperature 98.4 F (36.9 C), temperature source Oral, resp. rate 17, height 6' (1.829 m), weight 233 lb 6.4 oz (105.9 kg), last menstrual period 11/22/2020, SpO2 99 %, unknown if currently breastfeeding. General appearance: alert,  cooperative, appears stated age, and no distress Lungs: regular rate and effort Heart: regular rate  Abdomen: soft, non-tender Extremities: Homans sign is negative, no sign of DVT Presentation: cephalic EFM: 626 bpm, moderate variability, 15x15 accels, no decels Toco: UI Dilation: 2 Effacement (%): 50 Station: Ballotable Exam by:: Aja  Prenatal labs: ABO, Rh: --/--/B POS (06/04 0800) Antibody: NEG (06/04 0800) Rubella: 11.30 (12/14 0920) RPR: Non Reactive (04/05 1047)  HBsAg: Negative (12/14 0920)  HIV: Non Reactive (04/05 1047)  GBS: Negative/-- (05/25 0951)  2 hr GTT: Normal  Prenatal Transfer Tool  Maternal Diabetes: No Genetic Screening: Normal Maternal Ultrasounds/Referrals: Normal Fetal Ultrasounds or other Referrals:  Referred to Materal Fetal Medicine  Maternal Substance Abuse:  No Significant Maternal Medications:  None Significant Maternal Lab Results: Group B Strep negative  Results for orders placed or performed during the hospital encounter of 08/02/21 (from the past 24 hour(s))  CBC   Collection Time: 08/02/21  7:45 AM  Result Value Ref Range   WBC 12.8 (H) 4.0 - 10.5 K/uL   RBC 3.33 (L) 3.87 - 5.11 MIL/uL   Hemoglobin 10.6 (L) 12.0 - 15.0 g/dL   HCT 32.0 (L) 36.0 - 46.0 %   MCV 96.1 80.0 - 100.0 fL   MCH 31.8 26.0 - 34.0 pg   MCHC 33.1 30.0 - 36.0 g/dL   RDW 13.7 11.5 - 15.5 %   Platelets 244 150 - 400 K/uL   nRBC 0.0 0.0 - 0.2 %  Protein / creatinine ratio, urine   Collection Time: 08/02/21  7:45 AM  Result Value Ref Range   Creatinine, Urine 76.78 mg/dL   Total Protein, Urine 13 mg/dL   Protein Creatinine Ratio 0.17 (H) 0.00 - 0.15 mg/mg[Cre]  Comprehensive metabolic panel   Collection Time: 08/02/21  7:45 AM  Result Value Ref Range   Sodium 137 135 - 145 mmol/L   Potassium 3.8 3.5 - 5.1 mmol/L   Chloride 108 98 - 111 mmol/L   CO2 21 (L) 22 - 32 mmol/L   Glucose, Bld 85 70 - 99 mg/dL   BUN 5 (L) 6 - 20 mg/dL   Creatinine, Ser 0.51 0.44 -  1.00 mg/dL   Calcium 8.9 8.9 - 10.3 mg/dL   Total Protein 5.8 (L) 6.5 - 8.1 g/dL   Albumin 2.5 (L) 3.5 - 5.0 g/dL   AST 15 15 - 41 U/L   ALT 9 0 - 44 U/L   Alkaline Phosphatase 241 (H) 38 - 126 U/L   Total Bilirubin 0.6 0.3 - 1.2 mg/dL   GFR,  Estimated >60 >60 mL/min   Anion gap 8 5 - 15  Type and screen   Collection Time: 08/02/21  8:00 AM  Result Value Ref Range   ABO/RH(D) B POS    Antibody Screen NEG    Sample Expiration      08/05/2021,2359 Performed at [facility], [place] [address] [place], [place] [address]     Patient Active Problem List   Diagnosis Date Noted   Gestational hypertension 08/02/2021   Fibroid uterus 07/01/2021   LGSIL on Pap smear of cervix 06/03/2021   Supervision of other normal pregnancy, antepartum 01/27/2021   Tobacco abuse 07/19/2015   Gestational hypertension w/o significant proteinuria in 3rd trimester 07/19/2015    Assessment: SHAE HINNENKAMP is a 36 y.o. G3P1011 at 68w0dhere for IOL for gHTN  1. Labor: Latent labor 2. FWB: Cat 1 3. Pain: Wants to avoid epidural 4. GBS: Negative 5. gHTN: No s/sx PEC, BP elevated but not severe range   Plan: Admit to L&D IOL at 336w0dor gHTN - '50mg'$  buccal cytotec given and pt encouraged to ambulate - regular diet, saline locked Paraguard IUD post-placental if epidural in place, if unmedicated will have it placed at postpartum appointment.  JaGabriel CarinaCNM  08/02/2021, 11:05 AM

## 2021-08-02 NOTE — Progress Notes (Signed)
Patient ID: Gabriella Anderson, female   DOB: August 10, 1985, 36 y.o.   MRN: [id number]  Labor Progress Note Gabriella Anderson is a 36 y.o. G3P1011 at 10w0dpresented for IOL for gHTN   S:  Pt has been ambulating, about to eat lunch. Doing well with FOB support at bedside and friends on the phone.  O:  BP (!) 145/69   Pulse 64   Temp 98.4 F (36.9 C) (Oral)   Resp 17   Ht 6' (1.829 m)   Wt 233 lb 6.4 oz (105.9 kg)   LMP 11/22/2020 (Approximate)   SpO2 99%   BMI 31.65 kg/m  EFM: baseline 135 bpm/ moderate variability/ 15x15 accels/ no decels  Toco/IUPC: UI with irregular mild contractions q5-131m SVE: Dilation: 4 Effacement (%): 50 Station: -1 Presentation: Vertex Exam by:: Chasady Longwell CNM Pitocin: None  A/P: [age].o. G3P1011 3773w0d. Labor: Latent 2. FWB: Cat 1 3. Pain: no c/o discomfort 4. gHTN: no s/sx PEC, BP elevated but not severe  Reviewed AROM vs Pitocin titration, baby further engaged but cervix still somewhat thick and contractions are not very often. Will begin Pitocin titration and assess from AROM shortly. Anticipate SVD.  JamGabriel CarinaNM 2:29 PM

## 2021-08-02 NOTE — Progress Notes (Signed)
Patient ID: CHARNELLE BERGEMAN, female   DOB: [birth year], 36 y.o.   MRN: [id number]  Labor Progress Note NANCE MCCOMBS is a 36 y.o. G3P1011 at 61w0dpresented for IOL for gHTN   S:  Pt has been ambulating, tolerating Pitocin well. Doing well with FOB support at bedside and friends on the phone.  O:  BP (!) 147/77   Pulse 65   Temp 98 F (36.7 C) (Oral)   Resp 17   Ht 6' (1.829 m)   Wt 233 lb 6.4 oz (105.9 kg)   LMP 11/22/2020 (Approximate)   SpO2 99%   BMI 31.65 kg/m  EFM: baseline 145 bpm/ moderate variability/ 15x15 accels/ no decels (often tracing maternal while pt walking) Toco/IUPC: q1-259m when pt ambulates, only feeling mild cramping SVE: Dilation: 5 Effacement (%): 60 Station: -1 Presentation: Vertex Exam by:: Jamilia, CNM Pitocin: 1220min  A/P: 35 40o. G3P1011 37w21w0d Labor: Active, AROM performed with clear return of fluid 2. FWB: Cat 1 3. Pain: doing well with familial support and walking 4. gHTN: no s/sx PEC, BP elevated but not severe  Encouraged continued ambulation or position changes in bed, will continue pitocin titration. Anticipate SVD.  JamiGabriel CarinaM 7:31 PM

## 2021-08-02 NOTE — Discharge Summary (Signed)
Postpartum Discharge Summary  Date of Service updated***     Patient Name: Gabriella Anderson DOB: 1985/08/10 MRN: [id number]  Date of admission: 08/02/2021 Delivery date:08/02/2021  Delivering provider: Renee Harder  Date of discharge: 08/02/2021  Admitting diagnosis: Gestational hypertension [O13.9] Intrauterine pregnancy: [redacted]w[redacted]d    Secondary diagnosis:  Principal Problem:   Gestational hypertension Active Problems:   LGSIL on Pap smear of cervix   Fibroid uterus   SVD (spontaneous vaginal delivery)  Additional problems: ***    Discharge diagnosis: Term Pregnancy Delivered and Gestational Hypertension                                              Post partum procedures:{Postpartum procedures:23558} Augmentation: AROM and Pitocin Complications: None  Hospital course: Induction of Labor With Vaginal Delivery   36y.o. yo G3P1011 at 336w0das admitted to the hospital 08/02/2021 for induction of labor.  Indication for induction: Gestational hypertension.  Patient had an uncomplicated labor course as follows: Membrane Rupture Time/Date: 7:24 PM ,08/02/2021   Delivery Method:Vaginal, Spontaneous  Episiotomy: None  Lacerations:    Details of delivery can be found in separate delivery note.  Patient had a routine postpartum course. Patient is discharged home 08/02/21.  Newborn Data: Birth date:08/02/2021  Birth time:10:18 PM  Gender:Female  Living status:Living  Apgars:8 ,9  Weight:   Magnesium Sulfate received: {Mag received:30440022} BMZ received: No Rhophylac:N/A MMR:N/A T-DaP:Given prenatally Flu: N/A Transfusion:{Transfusion received:30440034}  Physical exam  Vitals:   08/02/21 1800 08/02/21 1945 08/02/21 2035 08/02/21 2145  BP: (!) 147/77 140/65 135/81 139/60  Pulse: 65 66 64 (!) 58  Resp: '17 18  18  ' Temp:  98.1 F (36.7 C)  97.7 F (36.5 C)  TempSrc:  Axillary  Oral  SpO2:      Weight:      Height:       General: {Exam; general:21111117} Lochia: {Desc;  appropriate/inappropriate:30686::"appropriate"} Uterine Fundus: {Desc; firm/soft:30687} Incision: {Exam; incision:21111123} DVT Evaluation: {Exam; dvt:2111122} Labs: Lab Results  Component Value Date   WBC 12.8 (H) 08/02/2021   HGB 10.6 (L) 08/02/2021   HCT 32.0 (L) 08/02/2021   MCV 96.1 08/02/2021   PLT 244 08/02/2021      Latest Ref Rng & Units 08/02/2021    7:45 AM  CMP  Glucose 70 - 99 mg/dL 85    BUN 6 - 20 mg/dL 5    Creatinine 0.44 - 1.00 mg/dL 0.51    Sodium 135 - 145 mmol/L 137    Potassium 3.5 - 5.1 mmol/L 3.8    Chloride 98 - 111 mmol/L 108    CO2 22 - 32 mmol/L 21    Calcium 8.9 - 10.3 mg/dL 8.9    Total Protein 6.5 - 8.1 g/dL 5.8    Total Bilirubin 0.3 - 1.2 mg/dL 0.6    Alkaline Phos 38 - 126 U/L 241    AST 15 - 41 U/L 15    ALT 0 - 44 U/L 9     Edinburgh Score:     View : No data to display.           After visit meds:  Allergies as of 08/02/2021   No Known Allergies   Med Rec must be completed prior to using this [facility]*        Discharge home in stable condition Infant  Feeding: {Baby feeding:23562} Infant Disposition:{CHL IP OB HOME WITH TOIZTI:45809} Discharge instruction: per After Visit Summary and Postpartum booklet. Activity: Advance as tolerated. Pelvic rest for 6 weeks.  Diet: {OB XIPJ:82505397} Future Appointments: Future Appointments  Date Time Provider [place]  08/03/2021  1:30 PM Laury Deep, CNM [place] None   Follow up Visit:   Please schedule this patient for a In person postpartum visit in 6 weeks with the following provider: Any provider. Additional Postpartum F/U:[place] and BP check 1 week  High risk pregnancy complicated by: HTN Delivery mode:  Vaginal, Spontaneous  Anticipated Birth Control:  IUD, Paragard outpatient   08/02/2021 Renee Harder, CNM

## 2021-08-02 NOTE — Lactation Note (Signed)
This note was copied from a baby's chart. Lactation Consultation Note Mom chooses to formula feed.  Patient Name: Gabriella Anderson OLIDC'V Date: 08/02/2021   Age:34 hours  Maternal Data    Feeding    LATCH Score                    Lactation Tools Discussed/Used    Interventions    Discharge    Consult Status Consult Status: Complete    Theodoro Kalata 08/02/2021, 10:53 PM

## 2021-08-03 ENCOUNTER — Encounter: Payer: Medicaid Other | Admitting: Obstetrics and Gynecology

## 2021-08-03 ENCOUNTER — Ambulatory Visit: Payer: Medicaid Other

## 2021-08-03 MED ORDER — SIMETHICONE 80 MG PO CHEW: 80.0000 mg | CHEWABLE_TABLET | ORAL | Status: DC | PRN

## 2021-08-03 MED ORDER — SENNOSIDES-DOCUSATE SODIUM 8.6-50 MG PO TABS
2.0000 | ORAL_TABLET | Freq: Every day | ORAL | Status: DC
  Administered 2021-08-03 – 2021-08-04 (×2): 2 via ORAL
  Filled 2021-08-03 (×2): qty 2

## 2021-08-03 MED ORDER — COCONUT OIL OIL: 1.0000 "application " | TOPICAL_OIL | Status: DC | PRN

## 2021-08-03 MED ORDER — FUROSEMIDE 20 MG PO TABS
20.0000 mg | ORAL_TABLET | Freq: Every day | ORAL | Status: DC
  Administered 2021-08-03 – 2021-08-04 (×2): 20 mg via ORAL
  Filled 2021-08-03 (×2): qty 1

## 2021-08-03 MED ORDER — ONDANSETRON HCL 4 MG/2ML IJ SOLN: 4.0000 mg | INTRAMUSCULAR | Status: DC | PRN

## 2021-08-03 MED ORDER — TETANUS-DIPHTH-ACELL PERTUSSIS 5-2.5-18.5 LF-MCG/0.5 IM SUSY: 0.5000 mL | PREFILLED_SYRINGE | Freq: Once | INTRAMUSCULAR | Status: DC

## 2021-08-03 MED ORDER — ACETAMINOPHEN 325 MG PO TABS: 650.0000 mg | ORAL_TABLET | ORAL | Status: DC | PRN

## 2021-08-03 MED ORDER — ONDANSETRON HCL 4 MG PO TABS: 4.0000 mg | ORAL_TABLET | ORAL | Status: DC | PRN

## 2021-08-03 MED ORDER — DIPHENHYDRAMINE HCL 25 MG PO CAPS: 25.0000 mg | ORAL_CAPSULE | Freq: Four times a day (QID) | ORAL | Status: DC | PRN

## 2021-08-03 MED ORDER — WITCH HAZEL-GLYCERIN EX PADS: 1.0000 "application " | MEDICATED_PAD | CUTANEOUS | Status: DC | PRN

## 2021-08-03 MED ORDER — PRENATAL MULTIVITAMIN CH
1.0000 | ORAL_TABLET | Freq: Every day | ORAL | Status: DC
  Administered 2021-08-03 – 2021-08-04 (×2): 1 via ORAL
  Filled 2021-08-03 (×2): qty 1

## 2021-08-03 MED ORDER — NIFEDIPINE ER OSMOTIC RELEASE 30 MG PO TB24
30.0000 mg | ORAL_TABLET | Freq: Every day | ORAL | Status: DC
Start: 2021-08-03 — End: 2021-08-04
  Administered 2021-08-03 – 2021-08-04 (×2): 30 mg via ORAL
  Filled 2021-08-03 (×2): qty 1

## 2021-08-03 MED ORDER — ZOLPIDEM TARTRATE 5 MG PO TABS: 5.0000 mg | ORAL_TABLET | Freq: Every evening | ORAL | Status: DC | PRN

## 2021-08-03 MED ORDER — DIBUCAINE (PERIANAL) 1 % EX OINT: 1.0000 "application " | TOPICAL_OINTMENT | CUTANEOUS | Status: DC | PRN

## 2021-08-03 NOTE — Clinical Social Work Maternal (Signed)
CLINICAL SOCIAL WORK MATERNAL/CHILD NOTE  Patient Details  Name: Gabriella Anderson MRN: [id number] Date of Birth: 27-Apr-1985  Date:  08/03/2021  Clinical Social Worker Initiating Note:  Kathrin Greathouse, [place] Date/Time: Initiated:  08/03/21/1245     Child's Name:  Chance Girtha Rm   Biological Parents:  Mother, Father (MOB: Raechal Raben 1985/09/15, Lanell Persons 12-22-1971)   Need for Interpreter:  None   Reason for Referral:  Current Substance Use/Substance Use During Pregnancy     Address:  [address] [place] [address]    Phone number:  [phone] (home)     Additional phone number:   Household Members/Support Persons (HM/SP):   Household Member/Support Person 1, Household Member/Support Person 2   HM/SP Name Relationship DOB or Age  HM/SP -1 Lanell Persons Significant Other 12-22-1971  HM/SP -2 Cor'Myah Daughter 10-18-2014  HM/SP -3        HM/SP -4        HM/SP -5        HM/SP -6        HM/SP -7        HM/SP -8          Natural Supports (not living in the home):  Immediate Family   Professional Supports: None   Employment: Full-time   Type of Work: Administrator Group   Education:  [place] arranged:    Museum/gallery curator Resources:  [employer]   Other Resources:  Physicist, medical  , [place] Considerations Which May Impact Care:    Strengths:  Ability to meet basic needs  , Home prepared for child  , Pediatrician chosen   Psychotropic Medications:         Pediatrician:    [place] area  Pediatrician List:   [facility] ([address])  [place]      Pediatrician Fax Number:    Risk Factors/Current Problems:  Substance Use     Cognitive State:  Able to Concentrate  , Alert  , Linear Thinking  , Insightful     Mood/Affect:  Calm  , Comfortable  , Interested  , Happy      CSW Assessment: CSW received consult for drug exposed newborn. CSW met with MOB to offer support and complete assessment.    CSW met with MOB at bedside and introduced CSW role. CSW observed MOB sitting at edge of bed and FOB holding the infant. CSW offered MOB privacy. FOB left to give MOB privacy. MOB presented calm and engaged well with CSW during the visit. MOB confirmed that the information on hospital file is correct. MOB reported she lives with FOB "Elberta Fortis" and her daughter Cor'Myah. MOB reported that she works for [employer] and receives WIC/FS. MOB identified FOB and her grandfather as supports.   CSW inquired about MOB substance use during the pregnancy. MOB reported that she used marijuana early in the pregnancy and when she tried to quit, she went through withdrawals with a lack of appetite therefore she continued to use periodically throughout the pregnancy. MOB reported the last time she used was about three weeks ago. MOB denied using any other substance. MOB reported no alcohol use. CSW informed MOB about the hospital drug screen policy. MOB made aware that CSW will monitor the infant CDS/UDS and make a report to CPS, if warranted.  MOB reported understanding. CSW inquired about CPS history. MOB reported in 2016 [place] visited her because her daughter was positive for [facility] at birth. In 2020, her daughter's father made a report about her marijuana use and ask for DNA testing. MOB reported that both cases have been closed. MOB declined substance use resources.   MOB reported no mental health or concerns with postpartum depression. CSW provided education regarding the baby blues period vs. perinatal mood disorders, discussed treatment and gave resources for mental health follow up if concerns arise.  CSW recommended complete a self-evaluation during the postpartum time period using the New Mom Checklist from Postpartum Progress and encouraged MOB to contact a medical  professional if symptoms are noted at any time. MOB reported she feels comfortable reaching out to her doctor if she has concerns. CSW assessed MOB for safety. MOB denied thoughts of harm to self and others.   CSW provided review of Sudden Infant Death Syndrome (SIDS) precautions. MOB reported she has all essential items for the infant including a bassinet where the infant will sleep. MOB has chosen [facility] for the infant's follow up care. CSW assessed MOB for additional needs. MOB reported no further need.   CSW identifies no further need for intervention and no barriers to discharge at this time.   CSW Plan/Description: CSW Will Continue to Monitor Umbilical Cord Tissue Drug Screen Results and Make Report if [facility], [place], Sudden Infant Death Syndrome (SIDS) Education, Perinatal Mood and Anxiety Disorder (PMADs) Education, No Further Intervention Required/No Barriers to Discharge    Lia Hopping, LCSW 08/03/2021, 1:54 PM

## 2021-08-03 NOTE — Progress Notes (Signed)
Post Partum Day 1 Subjective: no complaints, up ad lib, voiding, tolerating PO, and + flatus  Objective: Blood pressure 138/80, pulse (!) 58, temperature 97.9 F (36.6 C), temperature source Oral, resp. rate 18, height 6' (1.829 m), weight 105.9 kg, last menstrual period 11/22/2020, SpO2 99 %, unknown if currently breastfeeding.  Physical Exam:  General: alert, cooperative, and no distress Lochia: appropriate Uterine Fundus: firm DVT Evaluation: No evidence of DVT seen on physical exam.  Recent Labs    08/02/21 0745  HGB 10.6*  HCT 32.0*    Assessment/Plan:  Plan for discharge tomorrow, Circumcision prior to discharge, and Contraception : Paraguard at 6weeks postpartum visit.   BP was elevated in the 150s/70s-80s over 2hrs postpartum. Start Procardia '30mg'$  and Lasix '20mg'$  today.    LOS: 1 day   Spero Curb 08/03/2021, 7:46 AM

## 2021-08-04 ENCOUNTER — Other Ambulatory Visit (HOSPITAL_COMMUNITY): Payer: Self-pay

## 2021-08-04 ENCOUNTER — Encounter: Payer: Self-pay | Admitting: *Deleted

## 2021-08-04 MED ORDER — FUROSEMIDE 20 MG PO TABS
20.0000 mg | ORAL_TABLET | Freq: Every day | ORAL | 0 refills | Status: AC
  Filled 2021-08-04: qty 3, 3d supply, fill #0

## 2021-08-04 MED ORDER — NIFEDIPINE ER 30 MG PO TB24
30.0000 mg | ORAL_TABLET | Freq: Every day | ORAL | 0 refills | Status: AC
  Filled 2021-08-04: qty 30, 30d supply, fill #0

## 2021-08-04 MED ORDER — IBUPROFEN 600 MG PO TABS
600.0000 mg | ORAL_TABLET | Freq: Four times a day (QID) | ORAL | 0 refills | Status: AC
  Filled 2021-08-04: qty 30, 8d supply, fill #0

## 2021-08-04 NOTE — Discharge Instructions (Signed)

## 2021-08-10 ENCOUNTER — Ambulatory Visit (INDEPENDENT_AMBULATORY_CARE_PROVIDER_SITE_OTHER): Payer: Medicaid Other

## 2021-08-10 VITALS — BP 124/87 | HR 64 | Wt 205.0 lb

## 2021-08-10 DIAGNOSIS — Z0130 Encounter for examination of blood pressure without abnormal findings: Secondary | ICD-10-CM

## 2021-08-10 NOTE — Progress Notes (Signed)
..  Subjective:  Gabriella Anderson is a 36 y.o. female here for BP check.   Hypertension ROS: taking medications as instructed, no medication side effects noted, no TIA's, no chest pain on exertion, no dyspnea on exertion, and no swelling of ankles.    Objective:  BP 124/87   Pulse 64   Wt 205 lb (93 kg)   LMP 11/22/2020 (Approximate)   Breastfeeding No   BMI 27.80 kg/m   Appearance alert, well appearing, and in no distress. General exam BP noted to be well controlled today in office.    Assessment:   Blood Pressure well controlled.   Plan:  Current treatment plan is effective, no change in therapy.. PP visit is scheduled for 09/16/21

## 2021-09-16 ENCOUNTER — Encounter: Payer: Self-pay | Admitting: Obstetrics & Gynecology

## 2021-09-16 ENCOUNTER — Ambulatory Visit (INDEPENDENT_AMBULATORY_CARE_PROVIDER_SITE_OTHER): Payer: Medicaid Other | Admitting: Obstetrics & Gynecology

## 2021-09-16 VITALS — BP 137/92 | HR 47 | Ht 72.0 in | Wt 208.0 lb

## 2021-09-16 DIAGNOSIS — Z30430 Encounter for insertion of intrauterine contraceptive device: Secondary | ICD-10-CM

## 2021-09-16 DIAGNOSIS — R87612 Low grade squamous intraepithelial lesion on cytologic smear of cervix (LGSIL): Secondary | ICD-10-CM

## 2021-09-16 LAB — POCT URINE PREGNANCY: Preg Test, Ur: NEGATIVE

## 2021-09-16 MED ORDER — PARAGARD INTRAUTERINE COPPER IU IUD
1.0000 | INTRAUTERINE_SYSTEM | Freq: Once | INTRAUTERINE | Status: AC
  Administered 2021-09-16: 1 via INTRAUTERINE

## 2021-09-16 NOTE — Progress Notes (Signed)
    GYNECOLOGY OFFICE PROCEDURE NOTE  ARIYANA FAW is a 36 y.o. [id number] here for Paragard IUD insertion.   Last pap smear was on 12/22  IUD Insertion Procedure Note Patient identified, informed consent performed, consent signed.   Discussed risks of irregular bleeding, cramping, infection, malpositioning or misplacement of the IUD outside the uterus which may require further procedure such as laparoscopy. Time out was performed.  Urine pregnancy test negative.  Speculum placed in the vagina.  Cervix visualized.  Cleaned with Betadine x 2.  Grasped anteriorly with a single tooth tenaculum.  Uterus sounded to 9 cm.  Paragard placed per manufacturer's recommendations.  Strings trimmed to 3 cm. Tenaculum was removed, good hemostasis noted.  Patient tolerated procedure well.   Patient was given post-procedure instructions.  She was advised to have backup contraception for one week.  Patient was also asked to check IUD strings periodically and follow up in 4 weeks for IUD check.   Emeterio Reeve, MD, [place] Attending [place], [facility] for [facility], [employer] [facility] Patient ID: DENIESHA STENGLEIN, female   DOB: [birth year], 36 y.o.   MRN: [id number]

## 2021-09-16 NOTE — Progress Notes (Signed)
Administrations This Visit     paragard intrauterine copper IUD 1 each     Admin Date 09/16/2021 Action Given Dose 1 each Route Intrauterine Administered By Tamela Oddi, RMA

## 2021-09-16 NOTE — Progress Notes (Signed)
    [facility] Partum Visit Note  Gabriella Anderson is a 36 y.o. [phone] female who presents for a postpartum visit. She is 6 weeks postpartum following a normal spontaneous vaginal delivery.  I have fully reviewed the prenatal and intrapartum course. The delivery was at 75  gestational weeks.  Anesthesia: none. Postpartum course has been good, no complaints from patient. Baby is doing well. Baby is feeding by bottle - Similac Advance. Bleeding: Patient started menses on 09/14/21. Bowel function is normal. Bladder function is normal. Patient is sexually active. Contraception method is condoms. Postpartum depression screening: negative. Paragard IUD insertion today. Patient has no further questions or concerns at this time.   The pregnancy intention screening data noted above was reviewed. Potential methods of contraception were discussed. The patient elected to proceed with No data recorded.    Health Maintenance Due  Topic Date Due   COVID-19 Vaccine (1) Never done    The following portions of the patient's history were reviewed and updated as appropriate: allergies, current medications, past family history, past medical history, past social history, past surgical history, and problem list.  Review of Systems Pertinent items are noted in HPI.  Objective:  LMP 11/22/2020 (Approximate)    General:  alert, cooperative, and no distress   Breasts:  not indicated  Lungs:   Heart:  regular rate and rhythm  Abdomen: soft, non-tender; bowel sounds normal; no masses,  no organomegaly   Wound   GU exam:  normal       Assessment:    There are no diagnoses linked to this encounter.  normal postpartum exam.   Plan:   Essential components of care per ACOG recommendations:  1.  Mood and well being: Patient with negative depression screening today. Reviewed local resources for support.  - Patient tobacco use? No.   - hx of drug use? No.    2. Infant care and feeding:  -Patient currently  breastmilk feeding? No.  -Social determinants of health (SDOH) reviewed in EPIC. No concerns  3. Sexuality, contraception and birth spacing - Patient does not want a pregnancy in the next year.  Desired family size is 2 children.  - Reviewed reproductive life planning. Reviewed contraceptive methods based on pt preferences and effectiveness.  Patient desired IUD or IUS today.   - Discussed birth spacing of 18 months  4. Sleep and fatigue -Encouraged family/partner/community support of 4 hrs of uninterrupted sleep to help with mood and fatigue  5. Physical Recovery  - Discussed patients delivery and complications. She describes her labor as good. - Patient had a Vaginal, no problems at delivery. Patient had a 1st degree laceration. Perineal healing reviewed. Patient expressed understanding - Patient has urinary incontinence? No. - Patient is safe to resume physical and sexual activity  6.  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  -Breast Cancer screening indicated? no  7. Chronic Disease/Pregnancy Condition follow up:  LSIL pap needs colposcopy    Woodroe Mode, MD  [facility]

## 2021-11-03 ENCOUNTER — Ambulatory Visit (INDEPENDENT_AMBULATORY_CARE_PROVIDER_SITE_OTHER): Payer: Medicaid Other | Admitting: Obstetrics & Gynecology

## 2021-11-03 ENCOUNTER — Other Ambulatory Visit (HOSPITAL_COMMUNITY)
Admission: RE | Admit: 2021-11-03 | Discharge: 2021-11-03 | Disposition: A | Discharge status: Home / Self Care | Payer: Medicaid Other | Source: Ambulatory Visit | Attending: Obstetrics & Gynecology | Admitting: Obstetrics & Gynecology

## 2021-11-03 VITALS — BP 157/91 | Wt 205.9 lb

## 2021-11-03 DIAGNOSIS — R87612 Low grade squamous intraepithelial lesion on cytologic smear of cervix (LGSIL): Secondary | ICD-10-CM

## 2021-11-03 NOTE — Progress Notes (Signed)
Patient ID: Gabriella Anderson, female   DOB: [birth year], 36 y.o.   MRN: [id number]  Chief Complaint  Patient presents with   Colposcopy    HPI Gabriella Anderson is a 36 y.o. female.  [id number] No LMP recorded. (Menstrual status: IUD).  HPI  Indications: Pap smear on December 2022 showed: low-grade squamous intraepithelial neoplasia (LGSIL - encompassing HPV,mild dysplasia,CIN I). Previous colposcopy: . Prior cervical treatment: no treatment.  Past Medical History:  Diagnosis Date   Anemia    Fibroid    Hypertension    Infection    UTI   Trichimoniasis 04/11/2014   Vaginal Pap smear, abnormal     Past Surgical History:  Procedure Laterality Date   NO PAST SURGERIES     WISDOM TOOTH EXTRACTION Bilateral 2005    Family History  Problem Relation Age of Onset   Hypertension Mother    Cancer Mother        Leukemia   Diabetes Maternal Grandfather    Hypertension Maternal Grandfather    Diabetes type II Maternal Grandfather    Hearing loss Neg Hx     Social History Social History   Tobacco Use   Smoking status: Former    Packs/day: 0.25    Years: 10.00    Total pack years: 2.50    Types: Cigarettes    Passive exposure: Never   Smokeless tobacco: Never  Vaping Use   Vaping Use: Never used  Substance Use Topics   Alcohol use: Not Currently    Comment: ETOH use during early pregnancy   Drug use: Yes    Frequency: 2.0 times per week    Types: Marijuana    Comment: unknown    No Known Allergies  Current Outpatient Medications  Medication Sig Dispense Refill   acetaminophen (TYLENOL) 500 MG tablet Take 1,000 mg by mouth daily as needed for headache (pain). (Patient not taking: Reported on 11/03/2021)     Blood Pressure Monitoring (BLOOD PRESSURE MONITOR AUTOMAT) DEVI 1 Device by Does not apply route daily. Automatic blood pressure cuff regular size. To monitor blood pressure regularly at home. ICD-10 code:Z34.90 (Patient not taking: Reported on 09/16/2021) 1 each 0    cyclobenzaprine (FLEXERIL) 10 MG tablet Take 1 tablet (10 mg total) by mouth 3 (three) times daily as needed (headache). (Patient not taking: Reported on 09/16/2021) 30 tablet 2   furosemide (LASIX) 20 MG tablet Take 1 tablet (20 mg total) by mouth daily for 3 days. 3 tablet 0   ibuprofen (ADVIL) 600 MG tablet Take 1 tablet (600 mg total) by mouth every 6 (six) hours. (Patient not taking: Reported on 11/03/2021) 30 tablet 0   Misc. Devices (GOJJI WEIGHT SCALE) MISC 1 Device by Does not apply route daily as needed. To weight self daily as needed at home. ICD-10 code: Z34.90 (Patient not taking: Reported on 06/30/2021) 1 each 0   NIFEdipine (ADALAT CC) 30 MG 24 hr tablet Take 1 tablet (30 mg total) by mouth daily. 30 tablet 0   Prenatal Vit-Fe Fumarate-FA (MULTIVITAMIN-PRENATAL) 27-0.8 MG TABS tablet Take 1 tablet by mouth daily. (Patient not taking: Reported on 11/03/2021)     No current facility-administered medications for this visit.    Review of Systems Review of Systems  Constitutional: Negative.     Blood pressure (!) 157/91, weight 205 lb 14.4 oz (93.4 kg), not currently breastfeeding.  Physical Exam Physical Exam Vitals and nursing note reviewed. Exam conducted with a chaperone present.  Constitutional:  Appearance: Normal appearance.  Pulmonary:     Effort: Pulmonary effort is normal.  Genitourinary:    General: Normal vulva.     Exam position: Lithotomy position.     Vagina: Normal.     Cervix: Normal.  Neurological:     Mental Status: She is alert.  Psychiatric:        Mood and Affect: Mood normal.        Behavior: Behavior normal.  Patient given informed consent, signed copy in the chart, time out was performed.  Placed in lithotomy position. Cervix viewed with speculum and colposcope after application of acetic acid.   Colposcopy adequate?  yes Acetowhite lesions?mild, small Punctation?no Mosaicism?  no Abnormal vasculature?  no Biopsies?yes  700 ECC?yes    Data Reviewed pap  Assessment    Procedure Details  The risks and benefits of the procedure and Written informed consent obtained.  Speculum placed in vagina and excellent visualization of cervix achieved, cervix swabbed x 3 with acetic acid solution.  Specimens: cx biopsy  Complications: none.     Plan    Specimens labelled and sent to Pathology. LSIL-f/u 12 mo      Emeterio Reeve 11/03/2021, 4:25 PM

## 2021-11-03 NOTE — Progress Notes (Signed)
Pt is in the office for colpo today Negative UPT Advised pt to follow up with PCP about elevated BP

## 2021-11-04 LAB — POCT URINE PREGNANCY: Preg Test, Ur: NEGATIVE

## 2021-11-05 LAB — SURGICAL PATHOLOGY

## 2021-11-20 ENCOUNTER — Telehealth: Payer: Self-pay | Admitting: Emergency Medicine

## 2021-11-20 NOTE — Telephone Encounter (Signed)
TC to patient to discuss results. No further questions.

## 2022-12-26 IMAGING — US US MFM OB DETAIL+14 WK
1 series · 13 of 28 positions shown · non-contrast
Comparison: none

[Series 1: us mfm ob detail+14 wk · 13 of 97 slices shown]
[im 4/97]
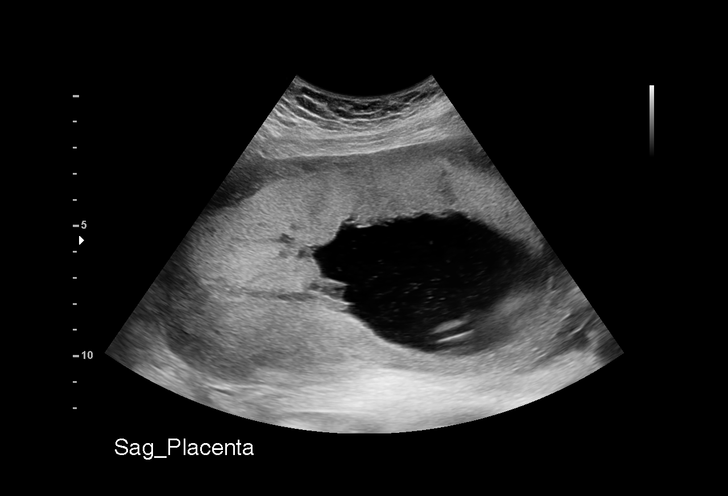
[im 11/97]
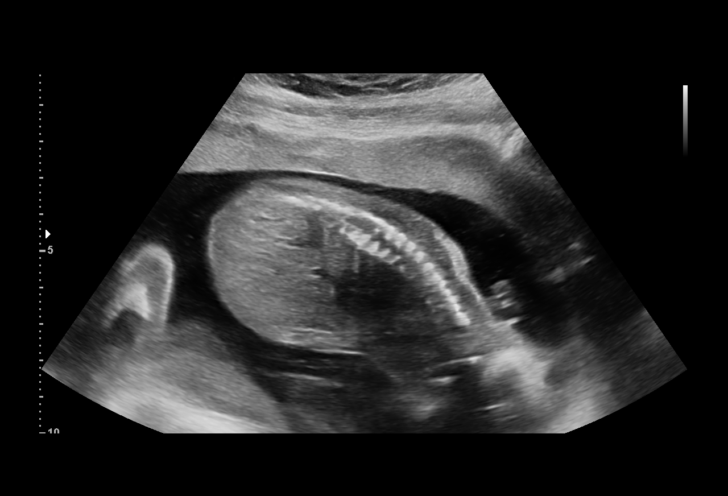
[im 18/97]
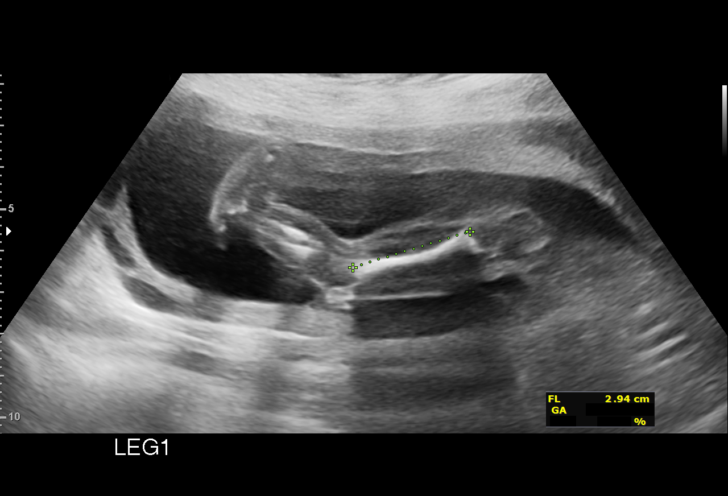
[im 25/97]
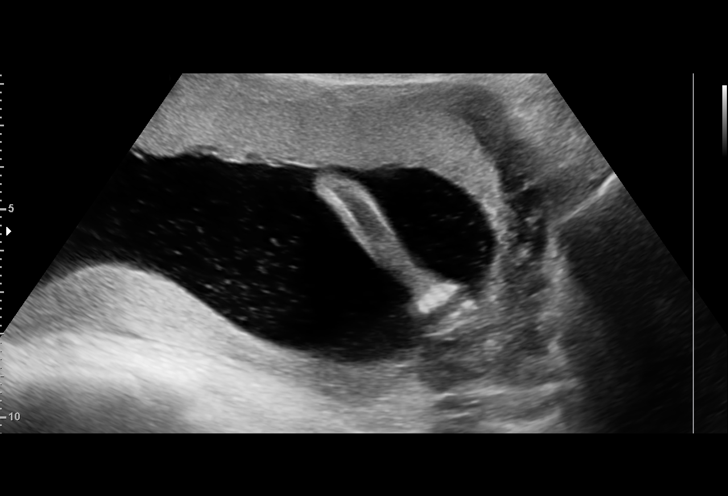
[im 33/97]
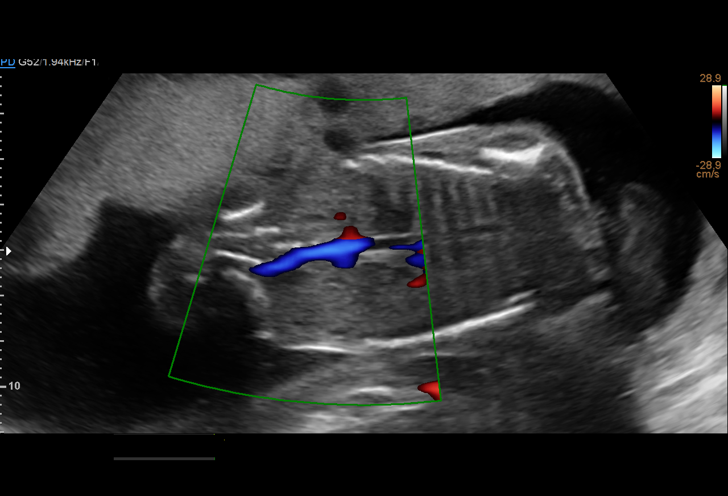
[im 40/97]
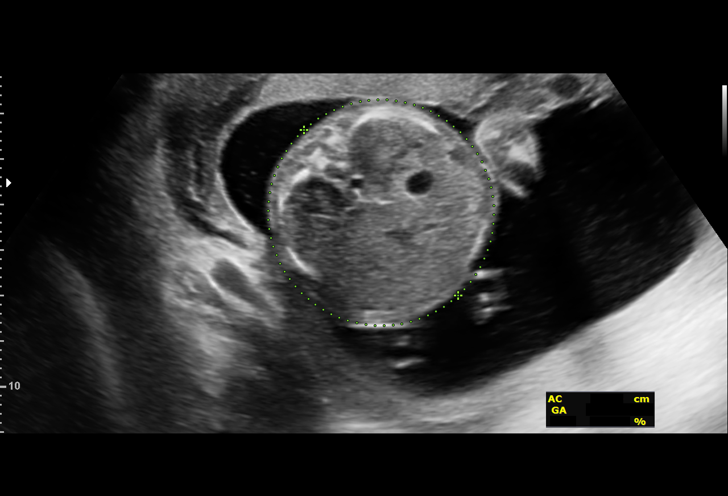
[im 50/97]
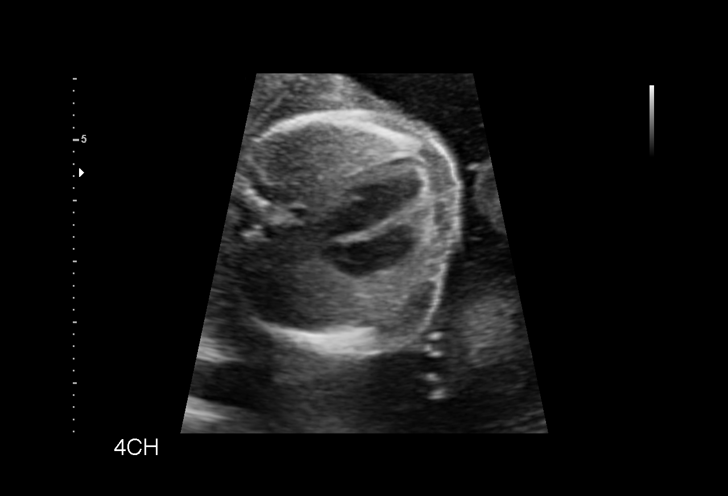
[im 57/97]
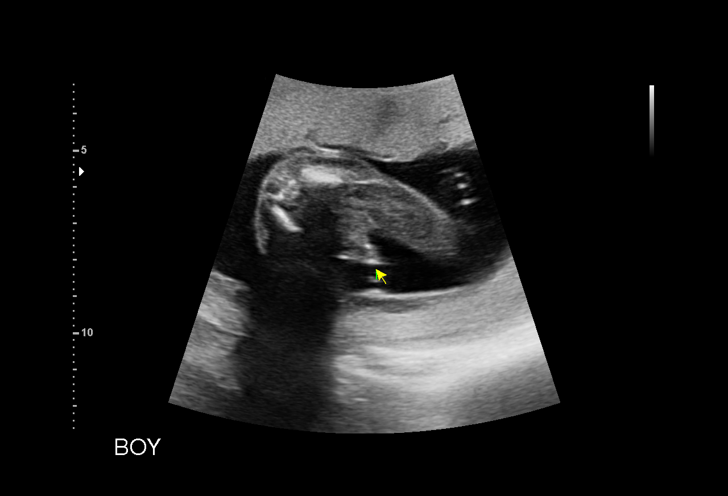
[im 65/97]
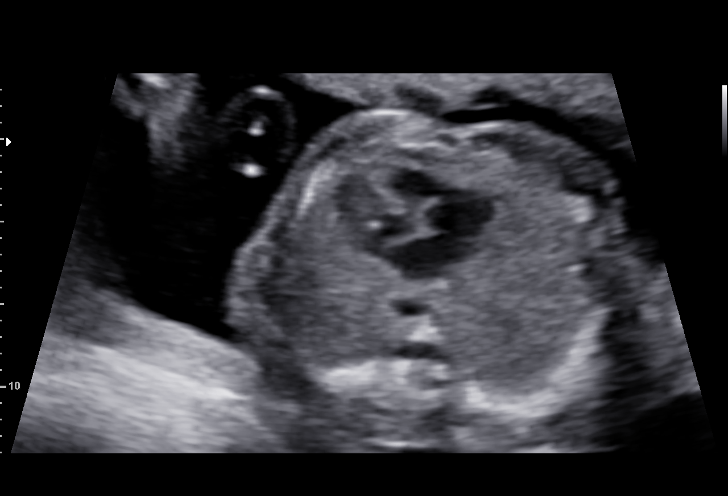
[im 72/97]
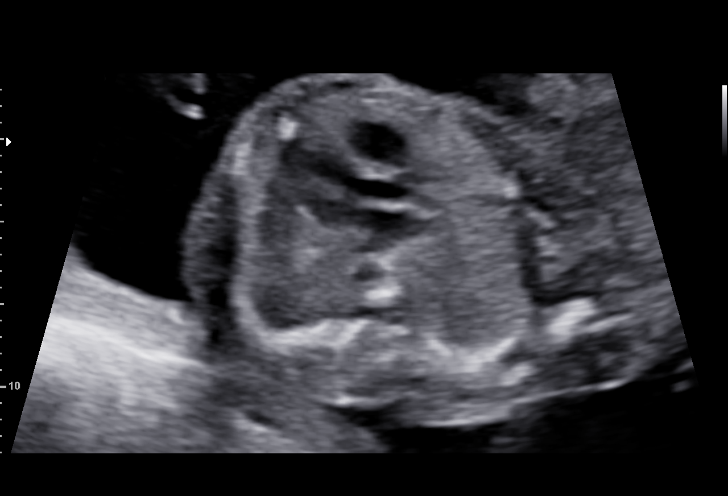
[im 79/97]
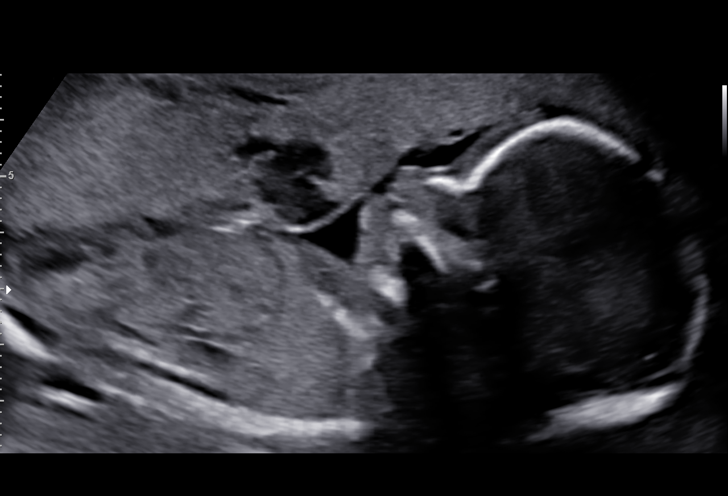
[im 86/97]
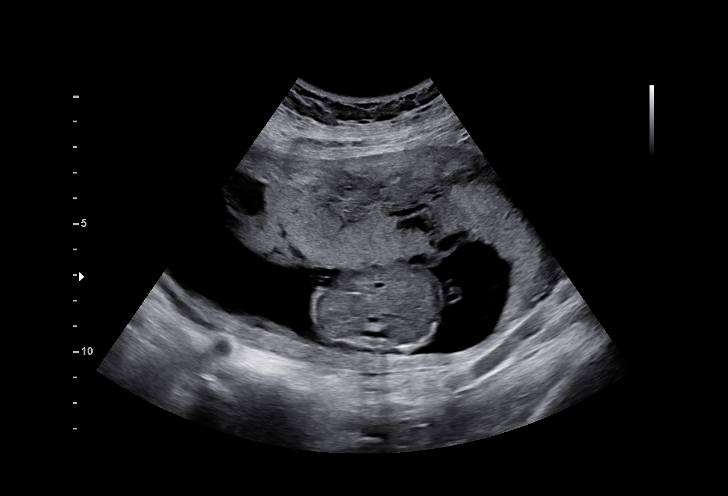
[im 93/97]
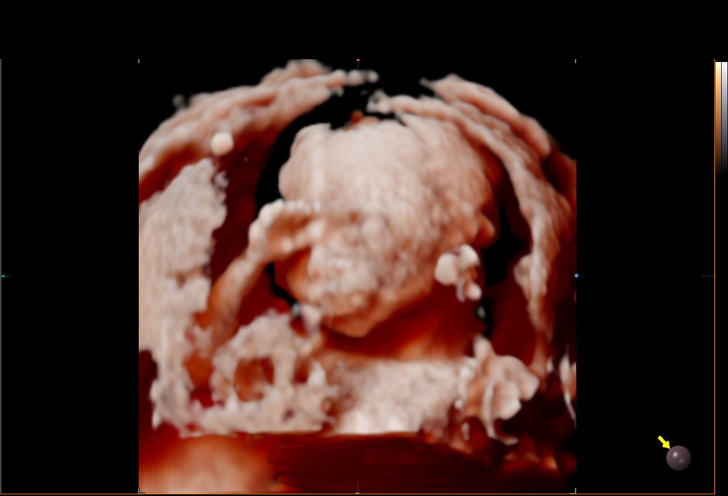

[13 of 28 positions shown; findings below may reference images not displayed]

[place]

Indications

 Poor obstetric history: Previous gestational
 HTN
 19 weeks gestation of pregnancy
 Encounter for antenatal screening for
 malformations
 LR NIPS, Neg Horizon
Fetal Evaluation

 Num Of Fetuses:         1
 Fetal Heart Rate(bpm):  148
 Cardiac Activity:       Observed
 Presentation:           Cephalic
 Placenta:               Anterior
 P. Cord Insertion:      Visualized, central

 Amniotic Fluid
 AFI FV:      Within normal limits

                             Largest Pocket(cm)

Biometry

 BPD:      44.2  mm     G. Age:  19w 3d         60  %    CI:        70.16   %    70 - 86
                                                         FL/HC:      17.4   %    16.1 -
 HC:      168.3  mm     G. Age:  19w 3d         59  %    HC/AC:      1.09        1.09 -
 AC:      153.7  mm     G. Age:  20w 4d         87  %    FL/BPD:     66.3   %
 FL:       29.3  mm     G. Age:  19w 0d         38  %    FL/AC:      19.1   %    20 - 24
 HUM:      29.5  mm     G. Age:  19w 5d         64  %
 CER:      19.4  mm     G. Age:  18w 6d         31  %
 LV:        6.4  mm
 CM:        4.6  mm
 Est. FW:     314  gm    0 lb 11 oz      83  %
Gestational Age

 LMP:           18w 2d        Date:  11/22/20                 EDD:   08/29/21
 U/S Today:     19w 4d                                        EDD:   08/20/21
 Best:          19w 1d     Det. By:  Early Ultrasound         EDD:   08/23/21
                                     (02/02/21)
Anatomy

 Cranium:               Appears normal         Aortic Arch:            Appears normal
 Cavum:                 Appears normal         Ductal Arch:            Appears normal
 Ventricles:            Appears normal         Diaphragm:              Appears normal
 Choroid Plexus:        Appears normal         Stomach:                Appears normal, left
                                                                       sided
 Cerebellum:            Appears normal         Abdomen:                Appears normal
 Posterior Fossa:       Appears normal         Abdominal Wall:         Appears nml (cord
                                                                       insert, abd wall)
 Nuchal Fold:           Appears normal         Cord Vessels:           Appears normal (3
                                                                       vessel cord)
 Face:                  Not well visualized    Kidneys:                Appear normal
 Lips:                  Not well visualized    Bladder:                Appears normal
 Thoracic:              Appears normal         Spine:                  Appears normal
 Heart:                 Appears normal; EIF    Upper Extremities:      Appears normal
 RVOT:                  Not well visualized    Lower Extremities:      Appears normal
 LVOT:                  Not well visualized

 Other:  VC, 3VV and 3VTV visualized. Fetus appears to be a male.
Cervix Uterus Adnexa

 Cervix
 Length:           3.18  cm.
 Normal appearance by transabdominal scan.

 Right Ovary
 Visualized.

 Left Ovary
 Not visualized.
Comments

 Reklamna Whitenton was seen for a detailed fetal anatomy scan.
 She denies any significant past medical history and denies
 any problems in her current pregnancy.
 She had a cell free DNA test earlier in her pregnancy which
 indicated a low risk for trisomy 21, 18, and 13. A male fetus is
 predicted.
 Based on the fetal biometry measurements obtained today,
 her EDC was changed to August 23, 2021, making her 19
 weeks and 1 day pregnant today.  The EDC August 23, 2021
 would be consistent with her first trimester ultrasound that
 was performed by radiology.
 On today's exam, an intracardiac echogenic focus was noted
 in the left ventricle of the fetal heart.  The small association
 between an echogenic focus and Down syndrome was
 discussed. Due to the echogenic focus noted today, the
 patient was offered and declined an amniocentesis today for
 definitive diagnosis of fetal aneuploidy.  She reports that she
 is comfortable with her negative cell free DNA test.
 The views of the fetal anatomy were limited today due to the
 fetal position.
 The patient was informed that anomalies may be missed due
 to technical limitations. If the fetus is in a suboptimal position
 or maternal habitus is increased, visualization of the fetus in
 the maternal uterus may be impaired.
 A follow-up exam was scheduled in 4 weeks to complete the
 views of the fetal anatomy and to confirm her dates.

 The patient stated that all of her questions have been
 answered today.

 A total of 30 minutes was spent counseling and coordinating
 the care for this patient.  Greater than 50% of the time was
 spent in direct face-to-face contact.

## 2023-04-12 IMAGING — US US MFM FETAL BPP W/O NON-STRESS
1 series · 15 of 28 positions shown · non-contrast
Comparison: none

[Series 1: us mfm fetal bpp w/o non-stress · 38 acquisitions, 15 frames shown]
[im 1/38]
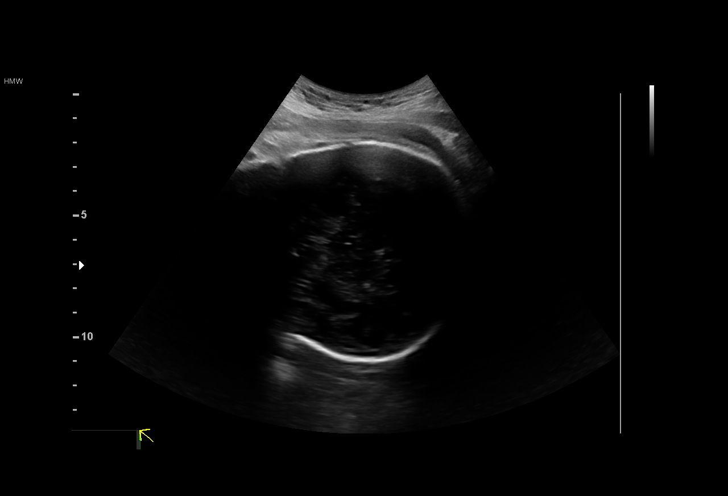
[im 3/38]
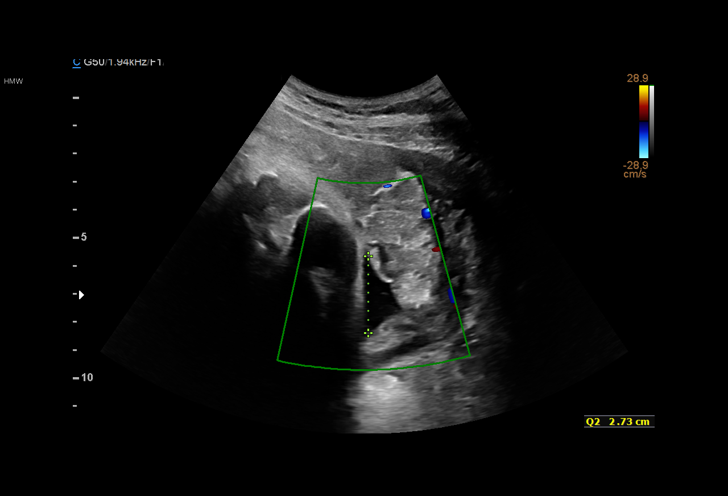
[im 6/38]
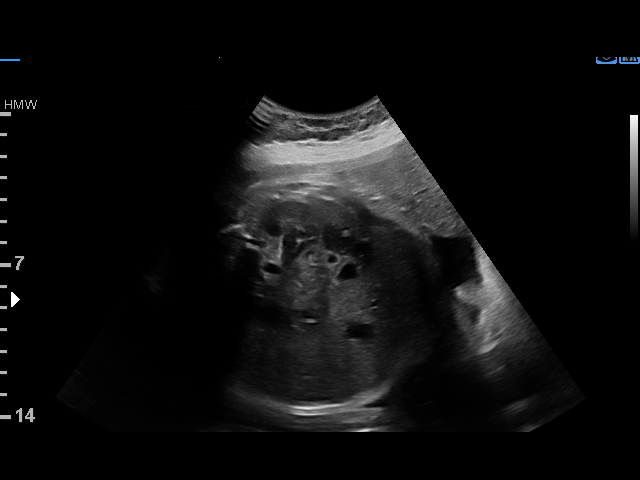
[im 9/38]
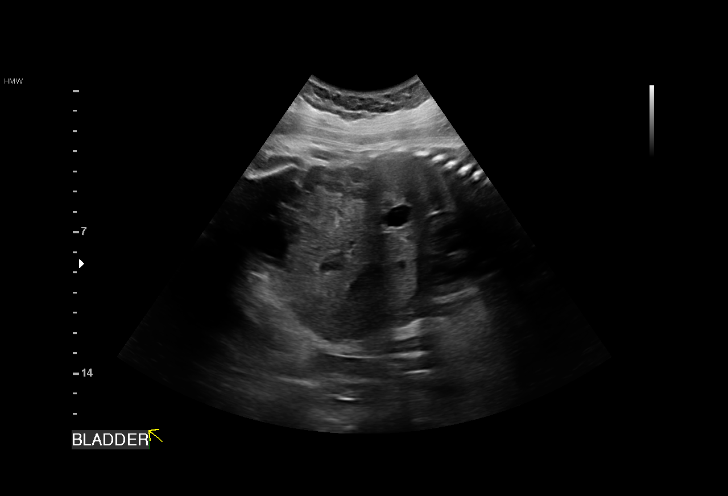
[im 11/38]
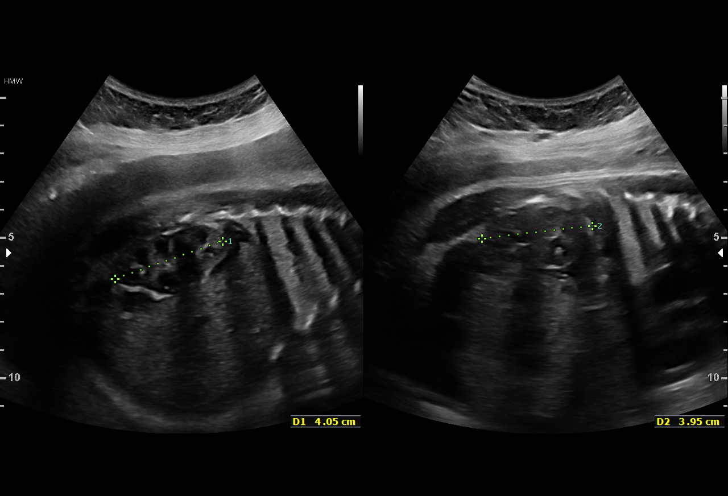
[im 14/38]
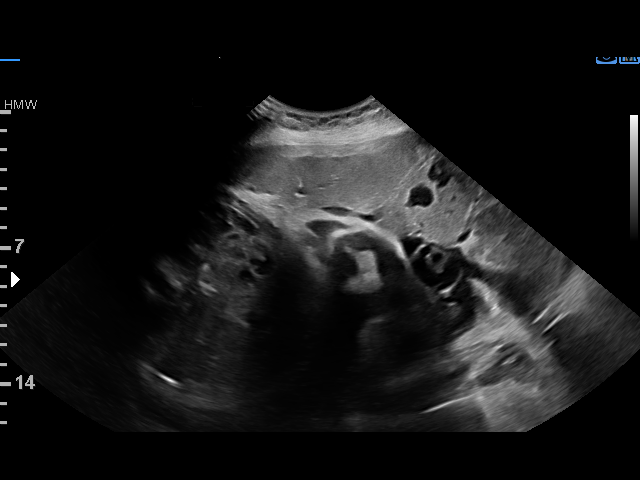
[im 17/38]
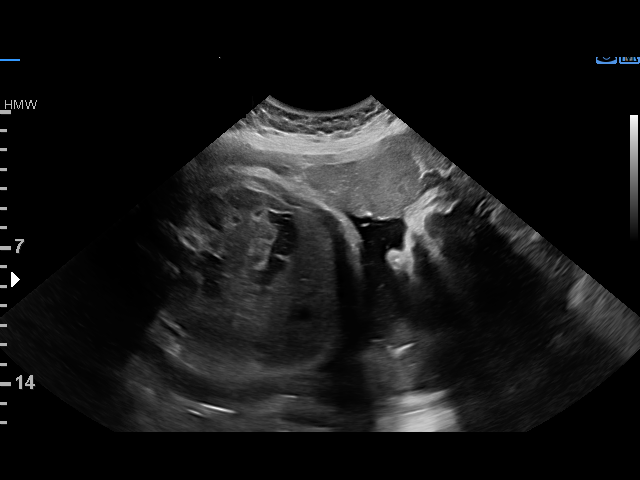
[im 20/38]
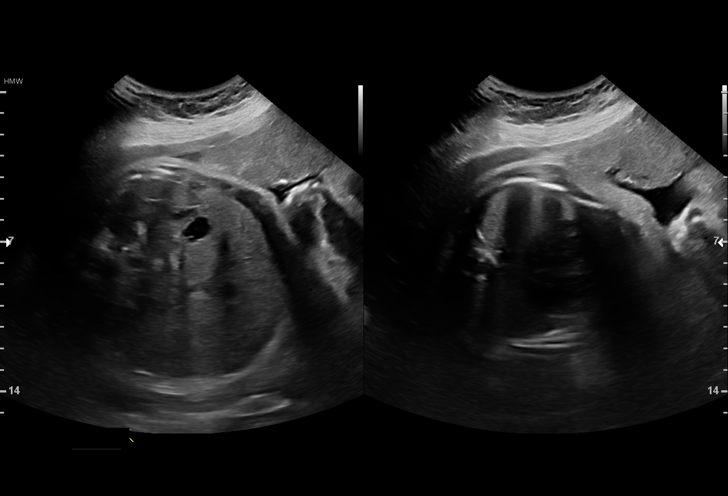
[im 21/38]
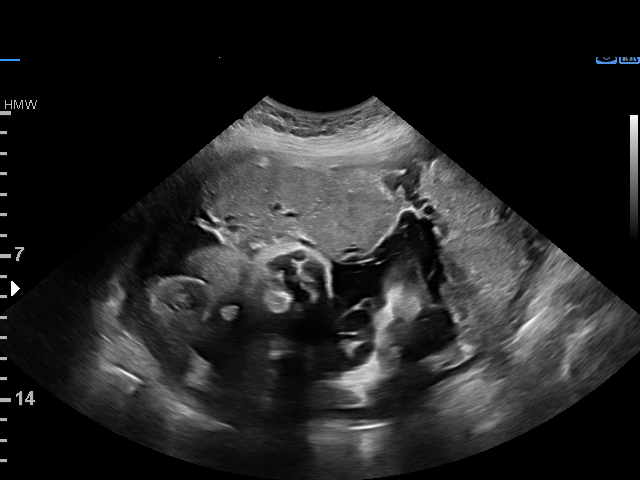
[im 24/38]
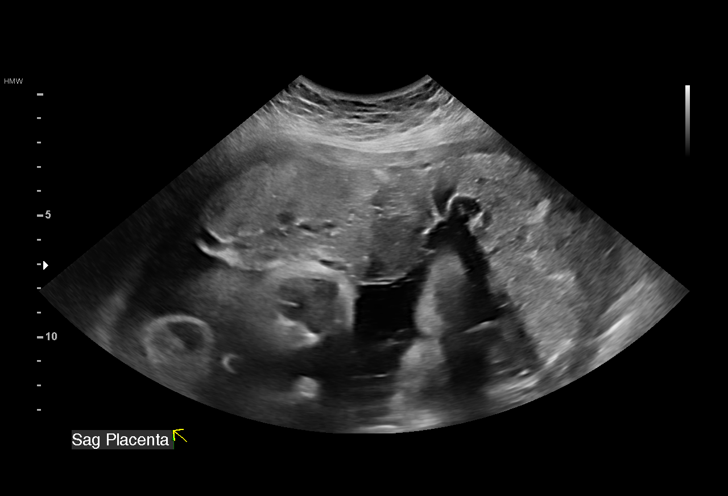
[im 27/38]
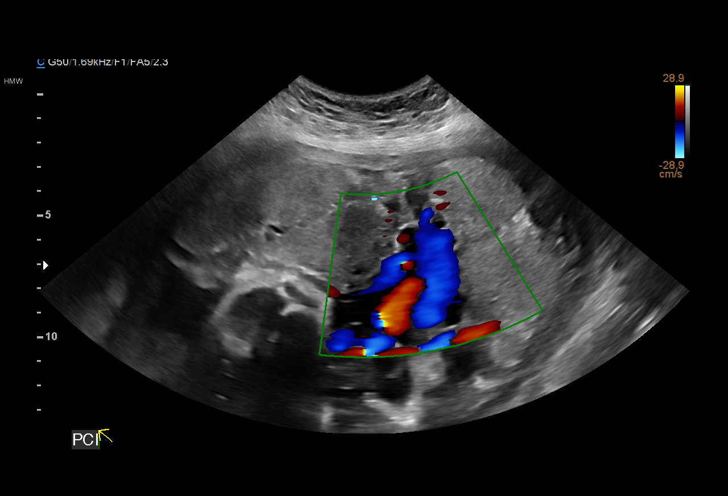
[im 29/38]
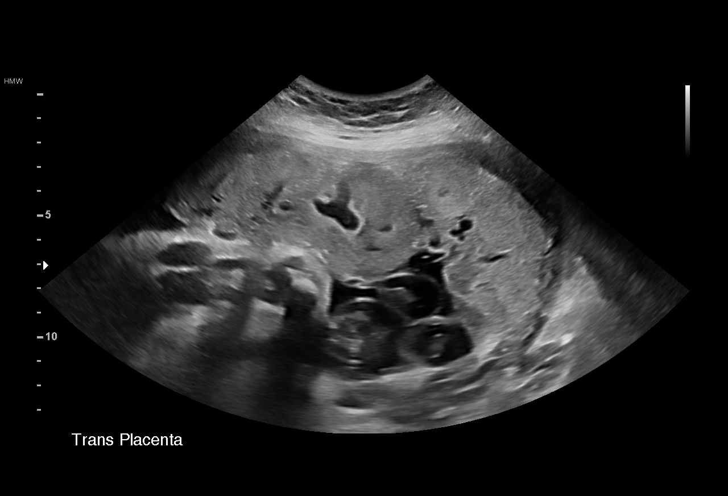
[im 32/38]
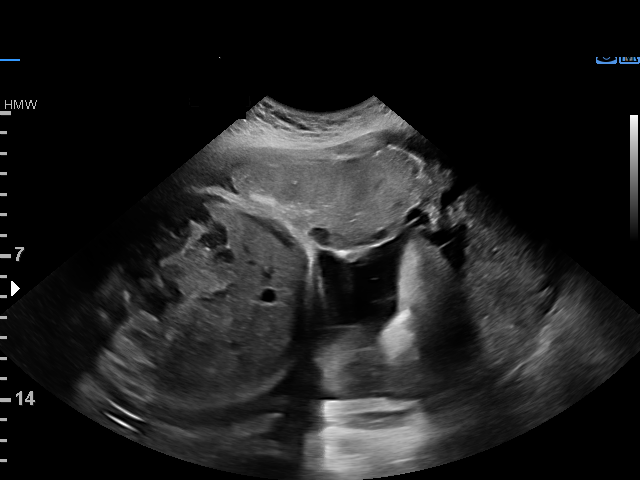
[im 35/38]
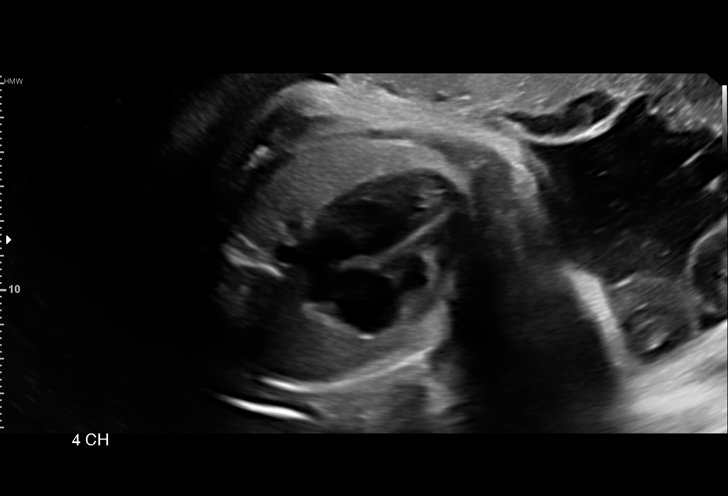
[im 38/38]
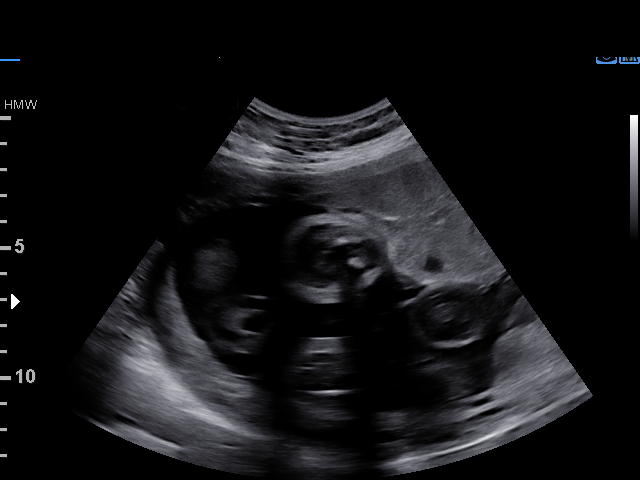

[15 of 28 positions shown; findings below may reference images not displayed]

[place]

Indications

 Gestational hypertension without significant
 proteinuria, third trimester
 Uterine fibroids affecting pregnancy in third  O34.13,
 trimester, antepartum
 Poor obstetric history: Previous gestational
 HTN
 LR NIPS, Neg Horizon
 34 weeks gestation of pregnancy
 Encounter for other antenatal screening
 follow-up
Fetal Evaluation

 Num Of Fetuses:         1
 Fetal Heart Rate(bpm):  131
 Cardiac Activity:       Observed
 Presentation:           Cephalic
 Placenta:               Anterior
 P. Cord Insertion:      Visualized, central

 Amniotic Fluid
 AFI FV:      Within normal limits

 AFI Sum(cm)     %Tile       Largest Pocket(cm)
 13.4            45          5

 RUQ(cm)       RLQ(cm)       LUQ(cm)        LLQ(cm)
 3.22          5
Biophysical Evaluation
 Amniotic F.V:   Within normal limits       F. Tone:        Observed
 F. Movement:    Observed                   Score:          [DATE]
 F. Breathing:   Observed
Gestational Age

 LMP:           33w 4d        Date:  11/22/20                 EDD:   08/29/21
 Best:          34w 3d     Det. By:  Early Ultrasound         EDD:   08/23/21
                                     (02/02/21)
Anatomy

 Cranium:               Appears normal         Kidneys:                Appear normal
 Thoracic:              Appears normal         Bladder:                Appears normal
 Stomach:               Appears normal, left
                        sided
Cervix Uterus Adnexa

 Cervix
 Not visualized (advanced GA >23wks)
Comments

 This patient was seen for a BPP due to  gestational
 hypertension.  Her blood pressure today were 143/78 and
 145/73.  She denies any signs or symptoms of preeclampsia.
 There is normal amniotic fluid noted on today's exam.
 A biophysical profile performed today was [DATE].
 Due to gestational hypertension, delivery should be
 considered at around 37 weeks.
 She will return in 1 week for another BPP.
